# Patient Record
Sex: Female | Born: 1935 | Race: White | Hispanic: No | Smoking: Never smoker
Health system: Southern US, Community
[De-identification: ages and names within clinical notes are randomized; demographics above are authoritative.]

## PROBLEM LIST (undated history)

## (undated) DIAGNOSIS — D649 Anemia, unspecified: Secondary | ICD-10-CM

## (undated) DIAGNOSIS — E079 Disorder of thyroid, unspecified: Secondary | ICD-10-CM

## (undated) DIAGNOSIS — Z8719 Personal history of other diseases of the digestive system: Secondary | ICD-10-CM

## (undated) DIAGNOSIS — IMO0002 Reserved for concepts with insufficient information to code with codable children: Secondary | ICD-10-CM

## (undated) DIAGNOSIS — I4891 Unspecified atrial fibrillation: Secondary | ICD-10-CM

## (undated) DIAGNOSIS — K922 Gastrointestinal hemorrhage, unspecified: Secondary | ICD-10-CM

## (undated) DIAGNOSIS — M25559 Pain in unspecified hip: Secondary | ICD-10-CM

## (undated) DIAGNOSIS — I10 Essential (primary) hypertension: Secondary | ICD-10-CM

## (undated) HISTORY — DX: Reserved for concepts with insufficient information to code with codable children: IMO0002

## (undated) HISTORY — DX: Unspecified atrial fibrillation: I48.91

## (undated) HISTORY — PX: THYROIDECTOMY: SHX17

## (undated) HISTORY — PX: FETAL BLOOD TRANSFUSION: SHX1602

## (undated) HISTORY — DX: Gastrointestinal hemorrhage, unspecified: K92.2

## (undated) HISTORY — DX: Pain in unspecified hip: M25.559

## (undated) HISTORY — DX: Anemia, unspecified: D64.9

## (undated) HISTORY — DX: Disorder of thyroid, unspecified: E07.9

## (undated) HISTORY — DX: Personal history of other diseases of the digestive system: Z87.19

## (undated) HISTORY — DX: Essential (primary) hypertension: I10

---
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
## 1997-03-22 HISTORY — PX: CHOLECYSTECTOMY: SHX55

## 2003-10-01 ENCOUNTER — Other Ambulatory Visit: Payer: Self-pay

## 2004-07-27 ENCOUNTER — Ambulatory Visit: Payer: Self-pay | Admitting: Internal Medicine

## 2004-08-19 ENCOUNTER — Ambulatory Visit: Payer: Self-pay | Admitting: Oncology

## 2004-09-16 ENCOUNTER — Ambulatory Visit: Payer: Self-pay | Admitting: Oncology

## 2005-04-29 ENCOUNTER — Ambulatory Visit: Payer: Self-pay | Admitting: Internal Medicine

## 2005-06-14 ENCOUNTER — Ambulatory Visit: Payer: Self-pay | Admitting: Internal Medicine

## 2005-08-18 ENCOUNTER — Ambulatory Visit: Payer: Self-pay | Admitting: Oncology

## 2005-10-01 ENCOUNTER — Ambulatory Visit: Payer: Self-pay | Admitting: Ophthalmology

## 2005-11-09 ENCOUNTER — Ambulatory Visit: Payer: Self-pay | Admitting: Internal Medicine

## 2006-04-01 ENCOUNTER — Ambulatory Visit: Payer: Self-pay | Admitting: Ophthalmology

## 2006-09-01 ENCOUNTER — Ambulatory Visit: Payer: Self-pay | Admitting: Internal Medicine

## 2006-09-05 ENCOUNTER — Ambulatory Visit: Payer: Self-pay | Admitting: Internal Medicine

## 2006-09-07 ENCOUNTER — Ambulatory Visit: Payer: Self-pay | Admitting: Internal Medicine

## 2006-10-06 ENCOUNTER — Ambulatory Visit: Payer: Self-pay | Admitting: Surgery

## 2006-10-26 ENCOUNTER — Ambulatory Visit: Payer: Self-pay | Admitting: Surgery

## 2006-11-02 ENCOUNTER — Ambulatory Visit: Payer: Self-pay | Admitting: Surgery

## 2007-07-08 ENCOUNTER — Other Ambulatory Visit: Payer: Self-pay

## 2007-07-08 ENCOUNTER — Inpatient Hospital Stay: Payer: Self-pay | Admitting: Internal Medicine

## 2007-07-17 ENCOUNTER — Ambulatory Visit: Payer: Self-pay | Admitting: Internal Medicine

## 2007-07-19 ENCOUNTER — Other Ambulatory Visit: Payer: Self-pay

## 2007-07-19 ENCOUNTER — Inpatient Hospital Stay: Payer: Self-pay | Admitting: Internal Medicine

## 2007-08-04 ENCOUNTER — Ambulatory Visit: Payer: Self-pay | Admitting: Otolaryngology

## 2007-08-10 ENCOUNTER — Ambulatory Visit: Payer: Self-pay | Admitting: Otolaryngology

## 2007-08-16 ENCOUNTER — Inpatient Hospital Stay: Payer: Self-pay | Admitting: Otolaryngology

## 2007-08-21 ENCOUNTER — Ambulatory Visit: Payer: Self-pay | Admitting: Oncology

## 2007-08-26 IMAGING — CR DG THORACIC SPINE 2-3V
1 series · 3 of 3 positions shown · non-contrast
Comparison: none

[Series 1: view not recorded · 0.17mm/px · 3 of 3 slices shown]
[im 1/3]
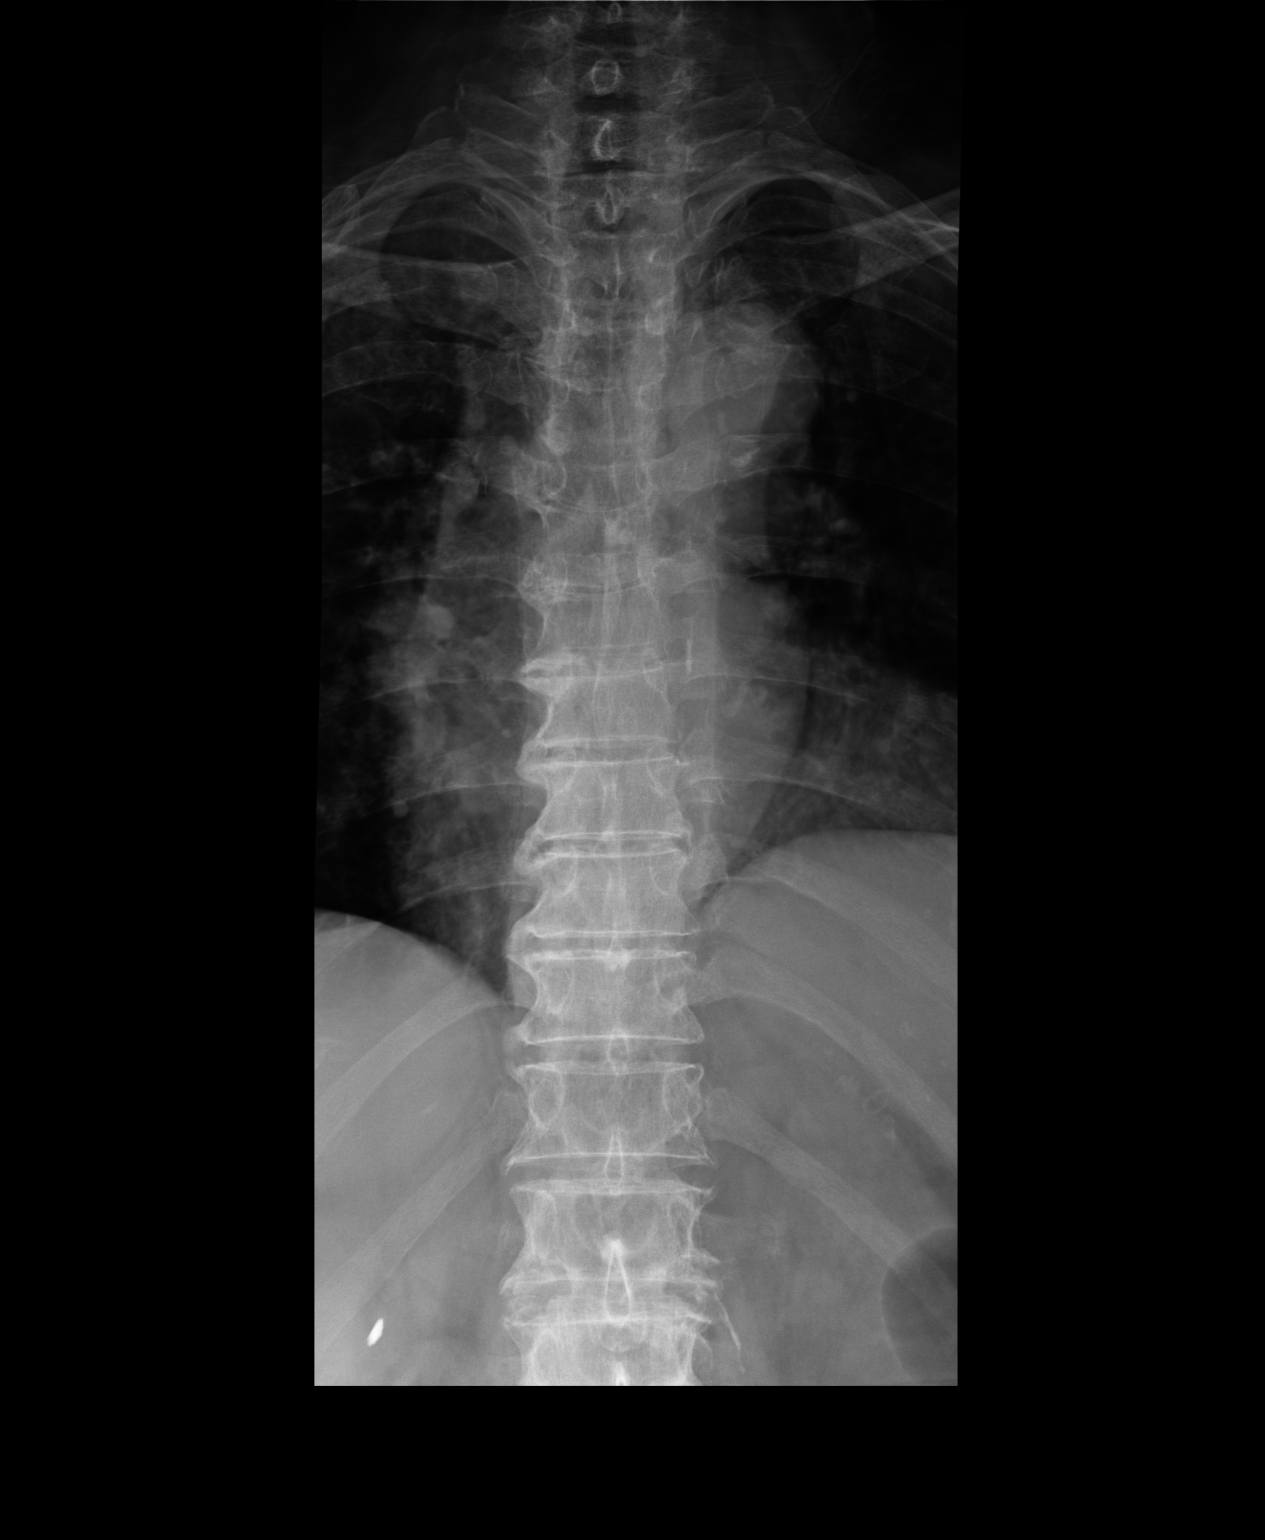
[im 2/3]
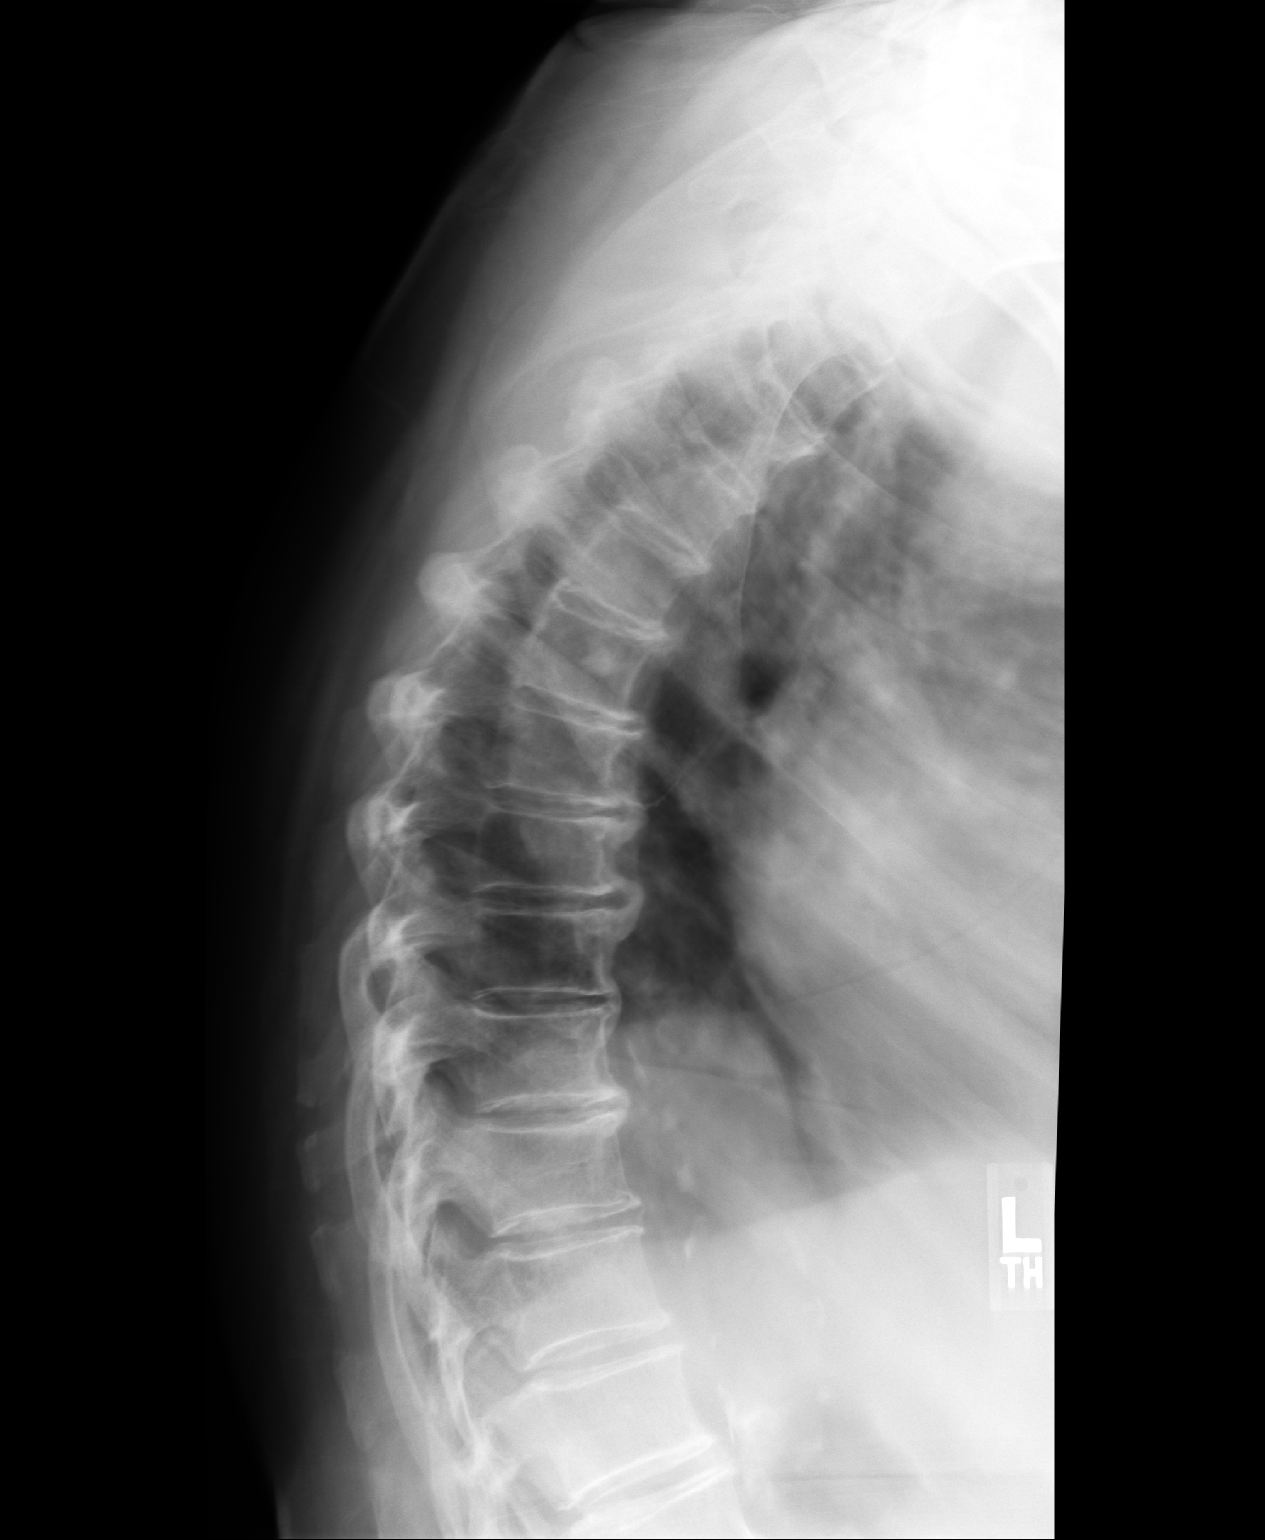
[im 3/3]
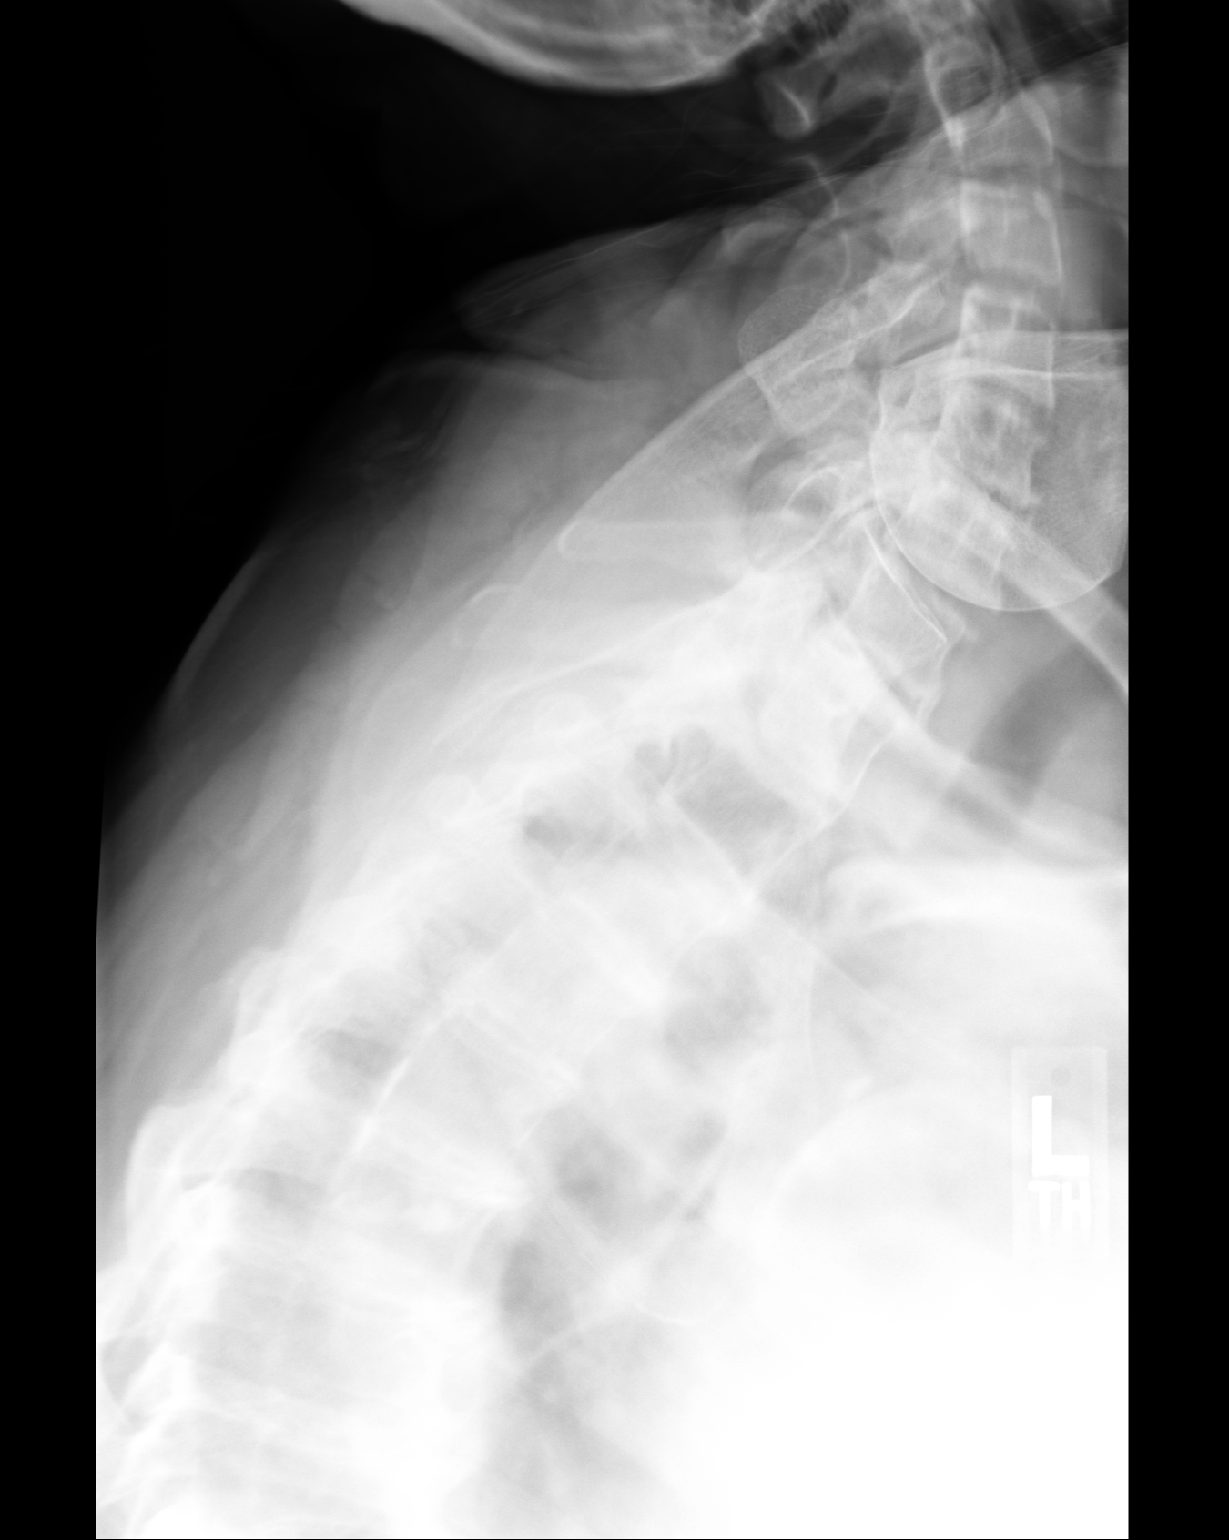

[3 of 3 positions shown; findings below may reference images not displayed]

Canned report from images found in remote index.

Refer to host system for actual result text.

## 2007-09-07 ENCOUNTER — Ambulatory Visit: Payer: Self-pay | Admitting: Oncology

## 2007-09-20 ENCOUNTER — Ambulatory Visit: Payer: Self-pay | Admitting: Oncology

## 2007-10-21 ENCOUNTER — Ambulatory Visit: Payer: Self-pay | Admitting: Oncology

## 2007-10-30 ENCOUNTER — Ambulatory Visit: Payer: Self-pay | Admitting: Internal Medicine

## 2007-11-21 ENCOUNTER — Ambulatory Visit: Payer: Self-pay | Admitting: Oncology

## 2007-12-04 ENCOUNTER — Ambulatory Visit: Payer: Self-pay | Admitting: Internal Medicine

## 2008-01-21 ENCOUNTER — Ambulatory Visit: Payer: Self-pay | Admitting: Oncology

## 2008-02-08 ENCOUNTER — Ambulatory Visit: Payer: Self-pay | Admitting: Oncology

## 2008-02-20 ENCOUNTER — Ambulatory Visit: Payer: Self-pay | Admitting: Oncology

## 2008-03-13 ENCOUNTER — Ambulatory Visit: Payer: Self-pay | Admitting: Internal Medicine

## 2008-06-20 ENCOUNTER — Ambulatory Visit: Payer: Self-pay | Admitting: Oncology

## 2008-06-27 ENCOUNTER — Ambulatory Visit: Payer: Self-pay | Admitting: Oncology

## 2008-07-20 ENCOUNTER — Ambulatory Visit: Payer: Self-pay | Admitting: Oncology

## 2008-12-20 ENCOUNTER — Ambulatory Visit: Payer: Self-pay | Admitting: Oncology

## 2008-12-25 ENCOUNTER — Ambulatory Visit: Payer: Self-pay | Admitting: Oncology

## 2009-01-03 IMAGING — US ULTRASOUND LEFT BREAST
1 series · 17 of 25 positions shown · non-contrast
Comparison: none

REASON FOR EXAM: Left breast nodule measuring 1.5 cm
COMMENTS:

[Series 1: ultrasound left breast · 17 of 33 slices shown]
[im 1/33]
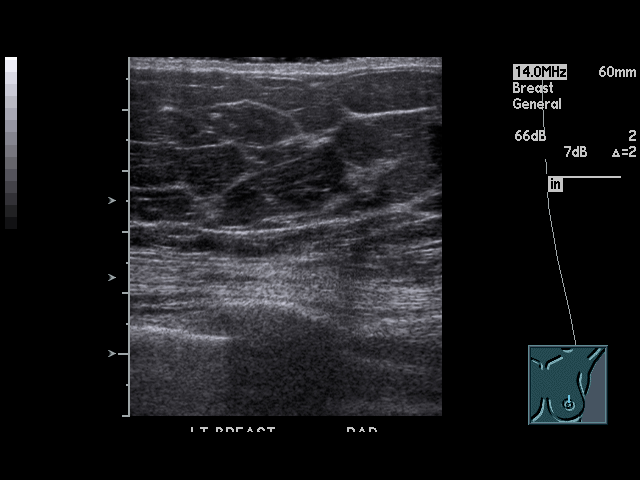
[im 3/33]
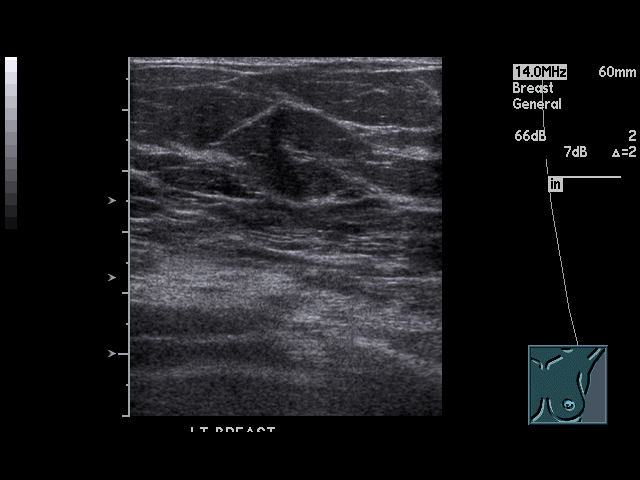
[im 5/33]
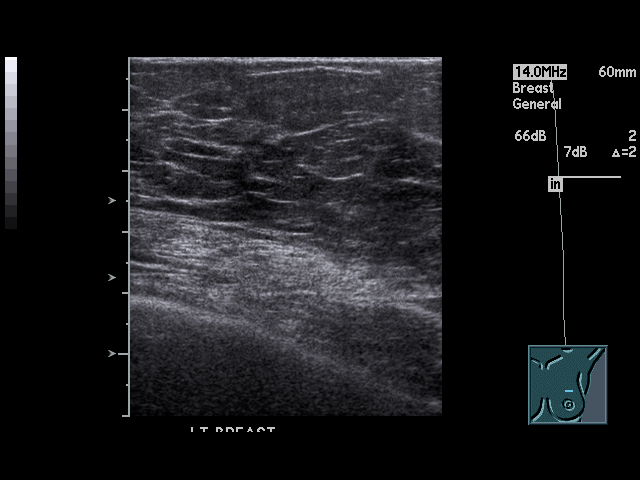
[im 7/33]
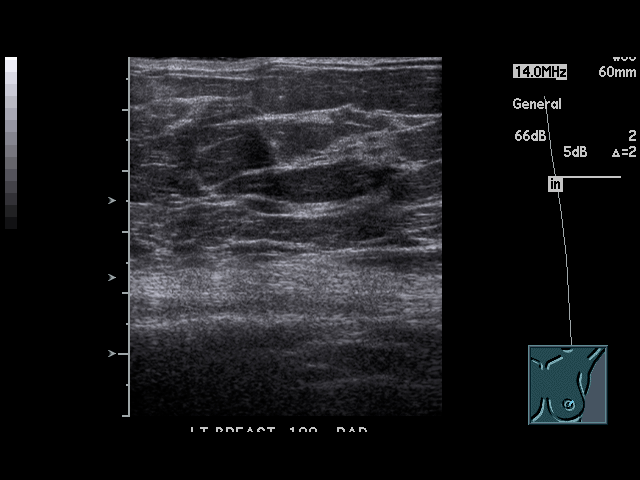
[im 9/33]
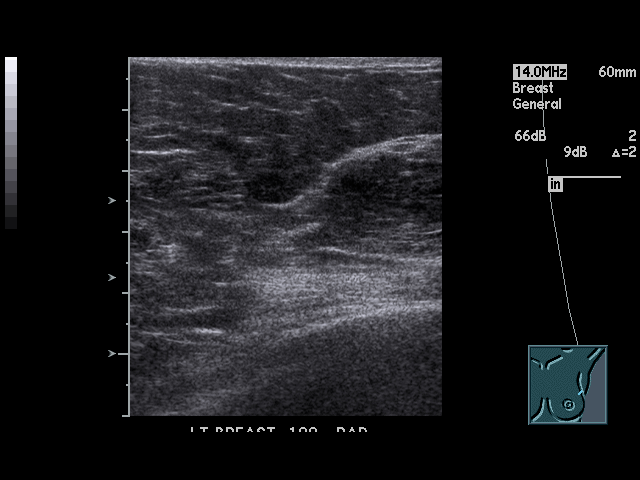
[im 11/33]
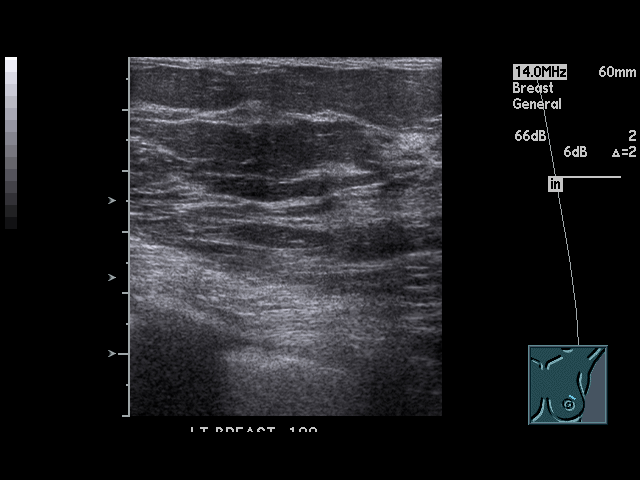
[im 13/33]
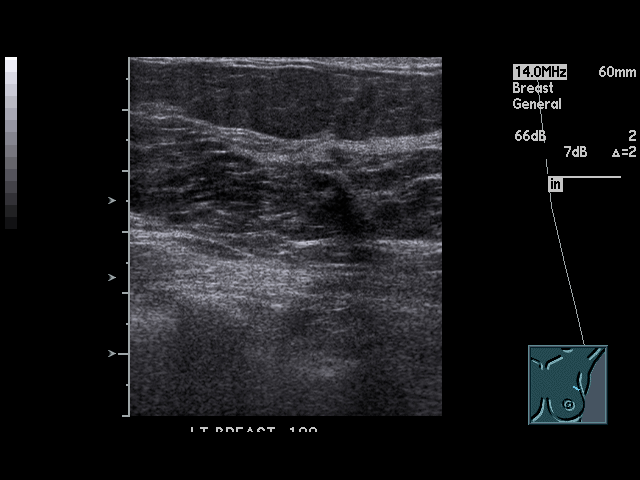
[im 15/33]
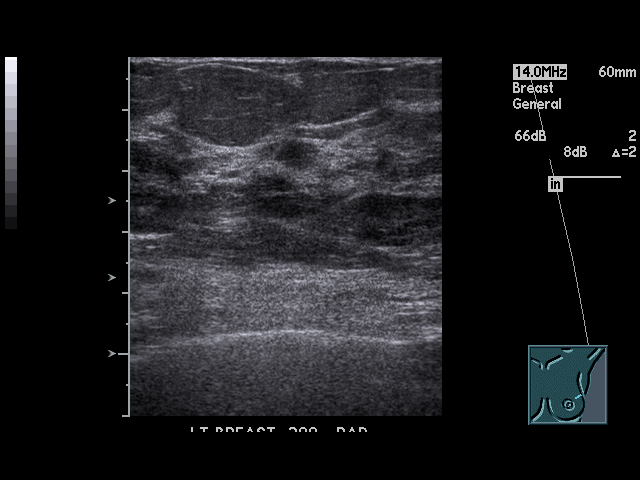
[im 17/33]
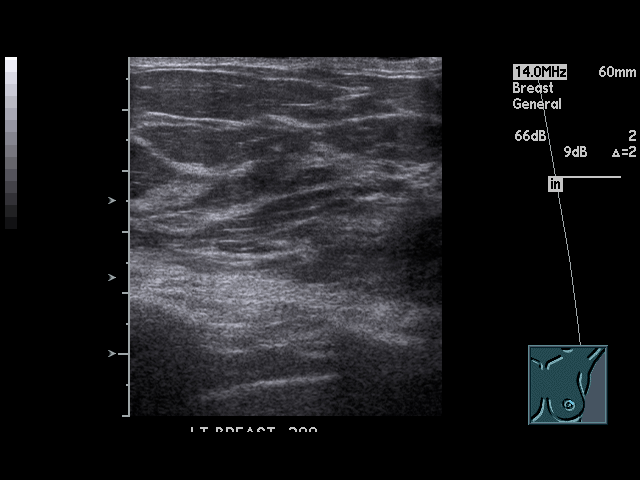
[im 18/33]
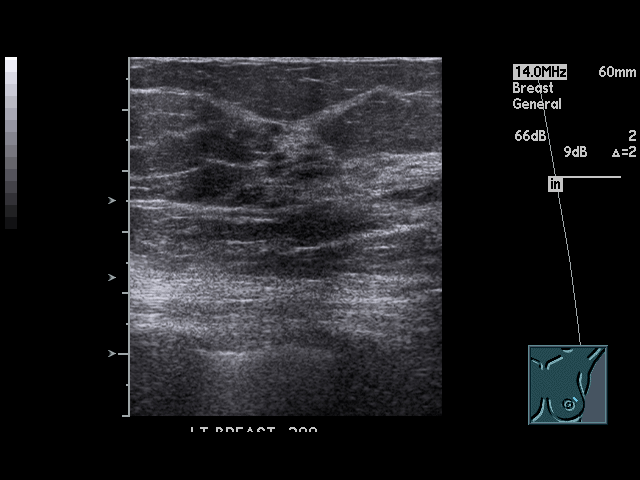
[im 21/33]
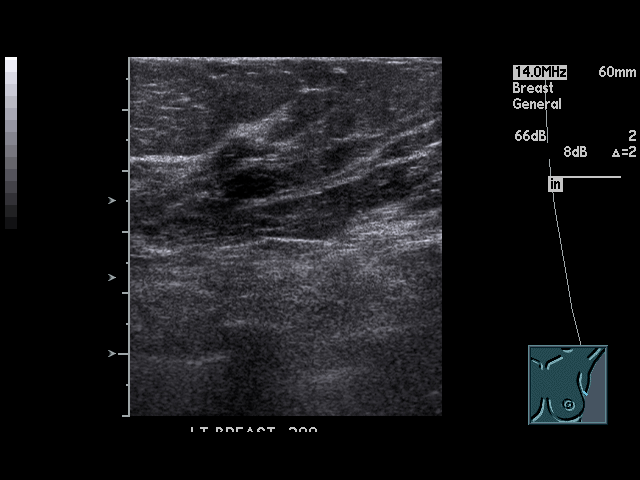
[im 22/33]
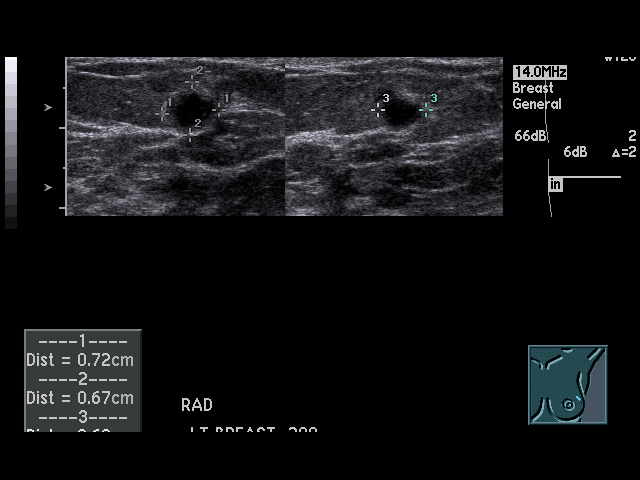
[im 25/33]
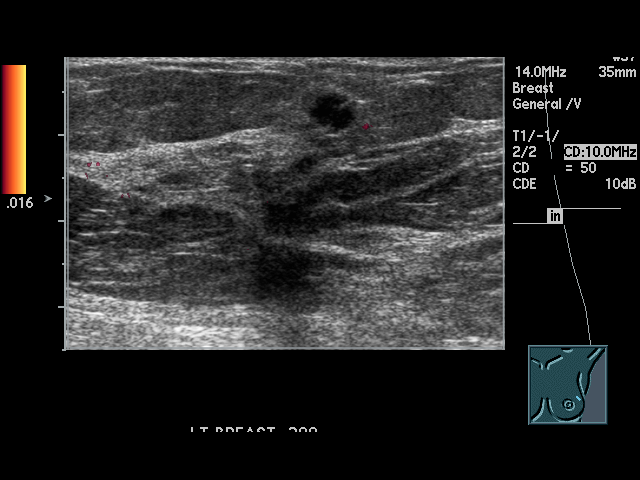
[im 26/33]
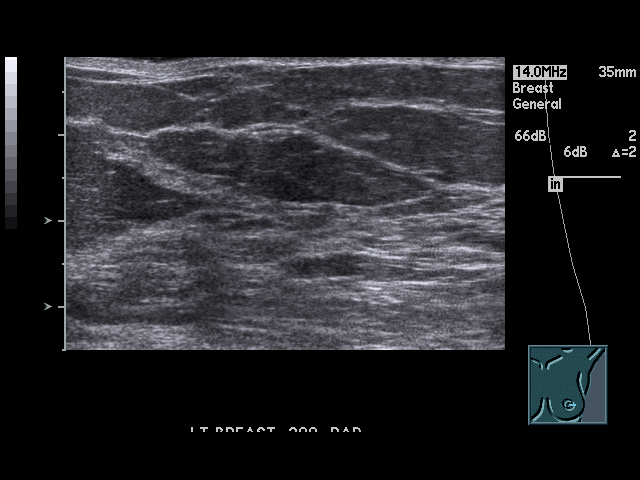
[im 29/33]
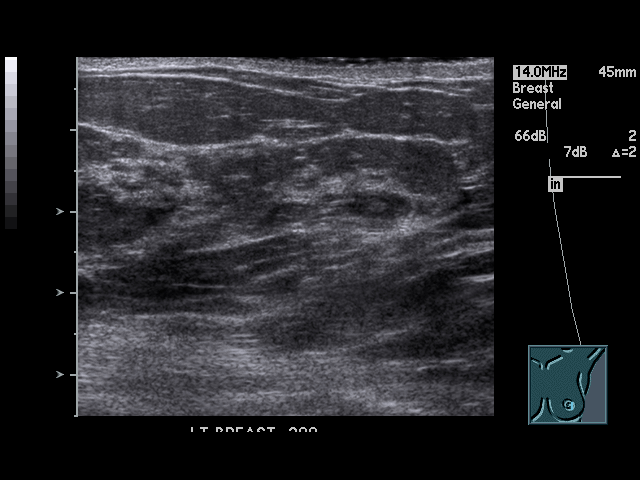
[im 30/33]
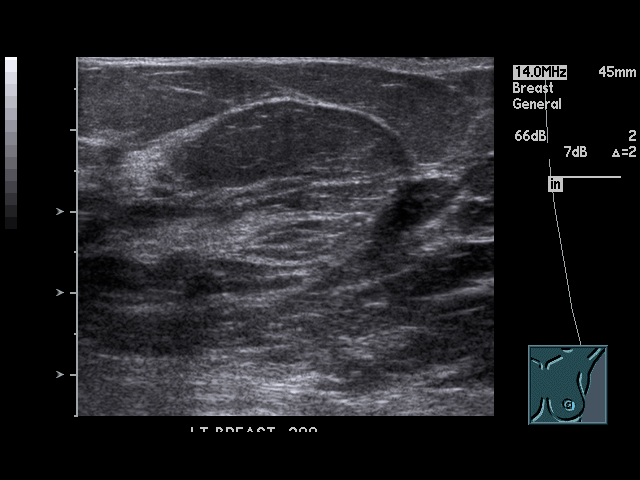
[im 33/33]
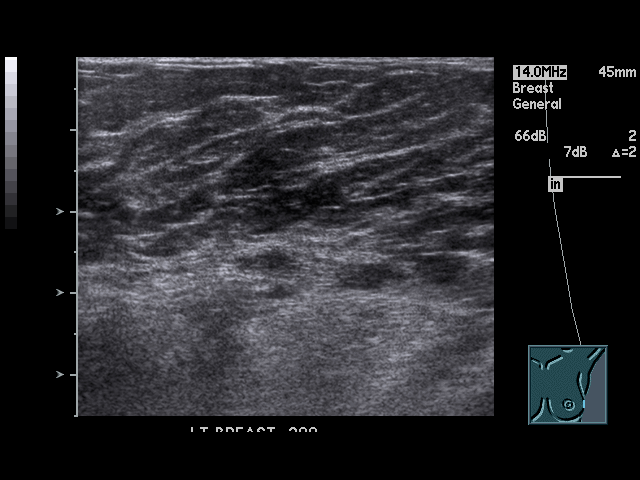

[17 of 25 positions shown; findings below may reference images not displayed]

PROCEDURE:     US  - US BREAST LEFT  - September 07, 2006 [DATE]

RESULT:     A hypoechoic nodule measuring approximately 7.0 mm with a thick
wall is noted at approximately 2 o'clock in the LEFT breast. This
corresponds to the nodular density noted in the LEFT breast. Needle
localization and biopsy is suggested for further evaluation as this could
represent a tumor.
IMPRESSION: Nodular density in the upper outer aspect of the LEFT breast
is hypoechoic with thick walls by ultrasound. Needle localization and
surgical removal of this nodule is suggested. This could represent a small
malignancy.

BI-RADS: Category 4 - Suspicious Abnormality

Thank you for this opportunity to contribute to the care of your patient.

A NEGATIVE MAMMOGRAM REPORT DOES NOT PRECLUDE BIOPSY OR OTHER EVALUATION OF
A CLINICALLY PALPABLE OR OTHERWISE SUSPICIOUS MASS OR LESION. BREAST CANCER
MAY NOT BE DETECTED BY MAMMOGRAPHY IN UP TO 10% OF CASES.

## 2009-01-20 ENCOUNTER — Ambulatory Visit: Payer: Self-pay | Admitting: Oncology

## 2009-11-03 IMAGING — CR DG CHEST 2V
1 series · 2 of 2 positions shown · non-contrast
Comparison: none

REASON FOR EXAM: sob
COMMENTS:

PROCEDURE:     DXR - DXR CHEST PA (OR AP) AND LATERAL  - July 08, 2007 [DATE]
RESULT:     Comparison: 09/01/2006.

[Series 1: view not recorded · 0.17mm/px · 2 of 2 slices shown]
[im 1/2]
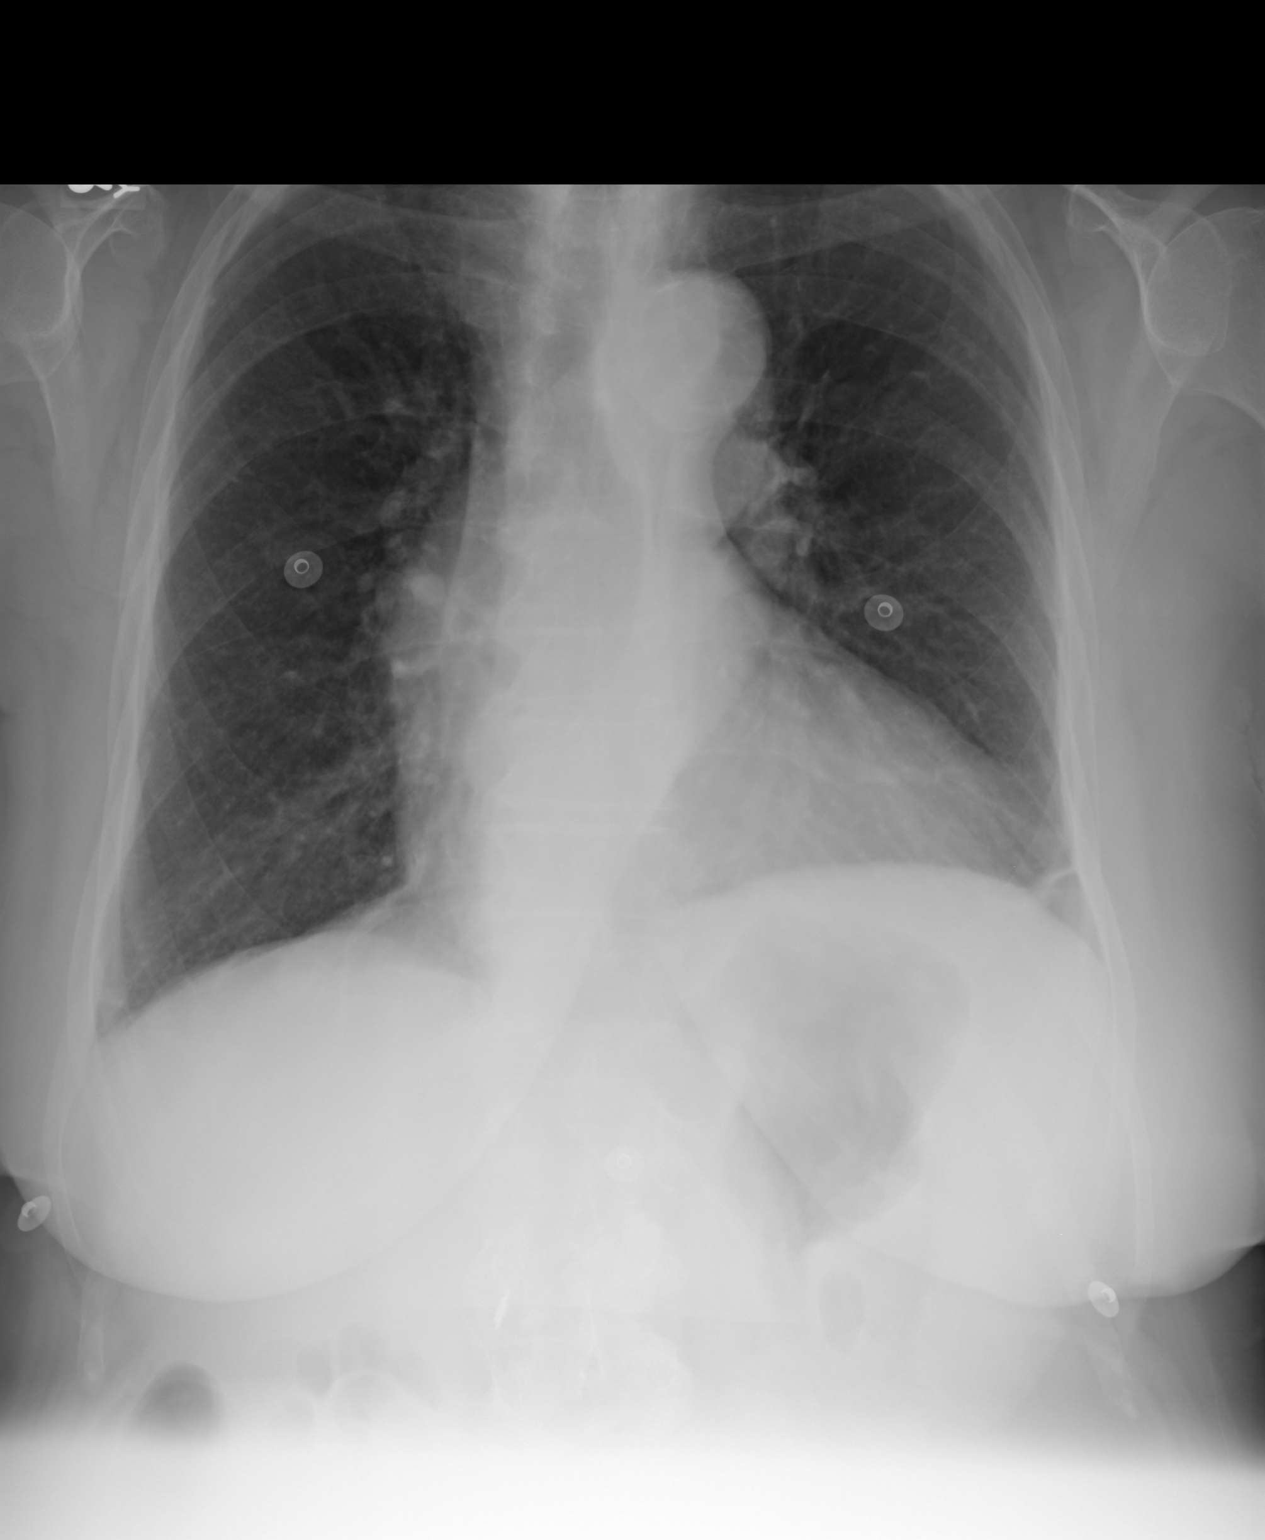
[im 2/2]
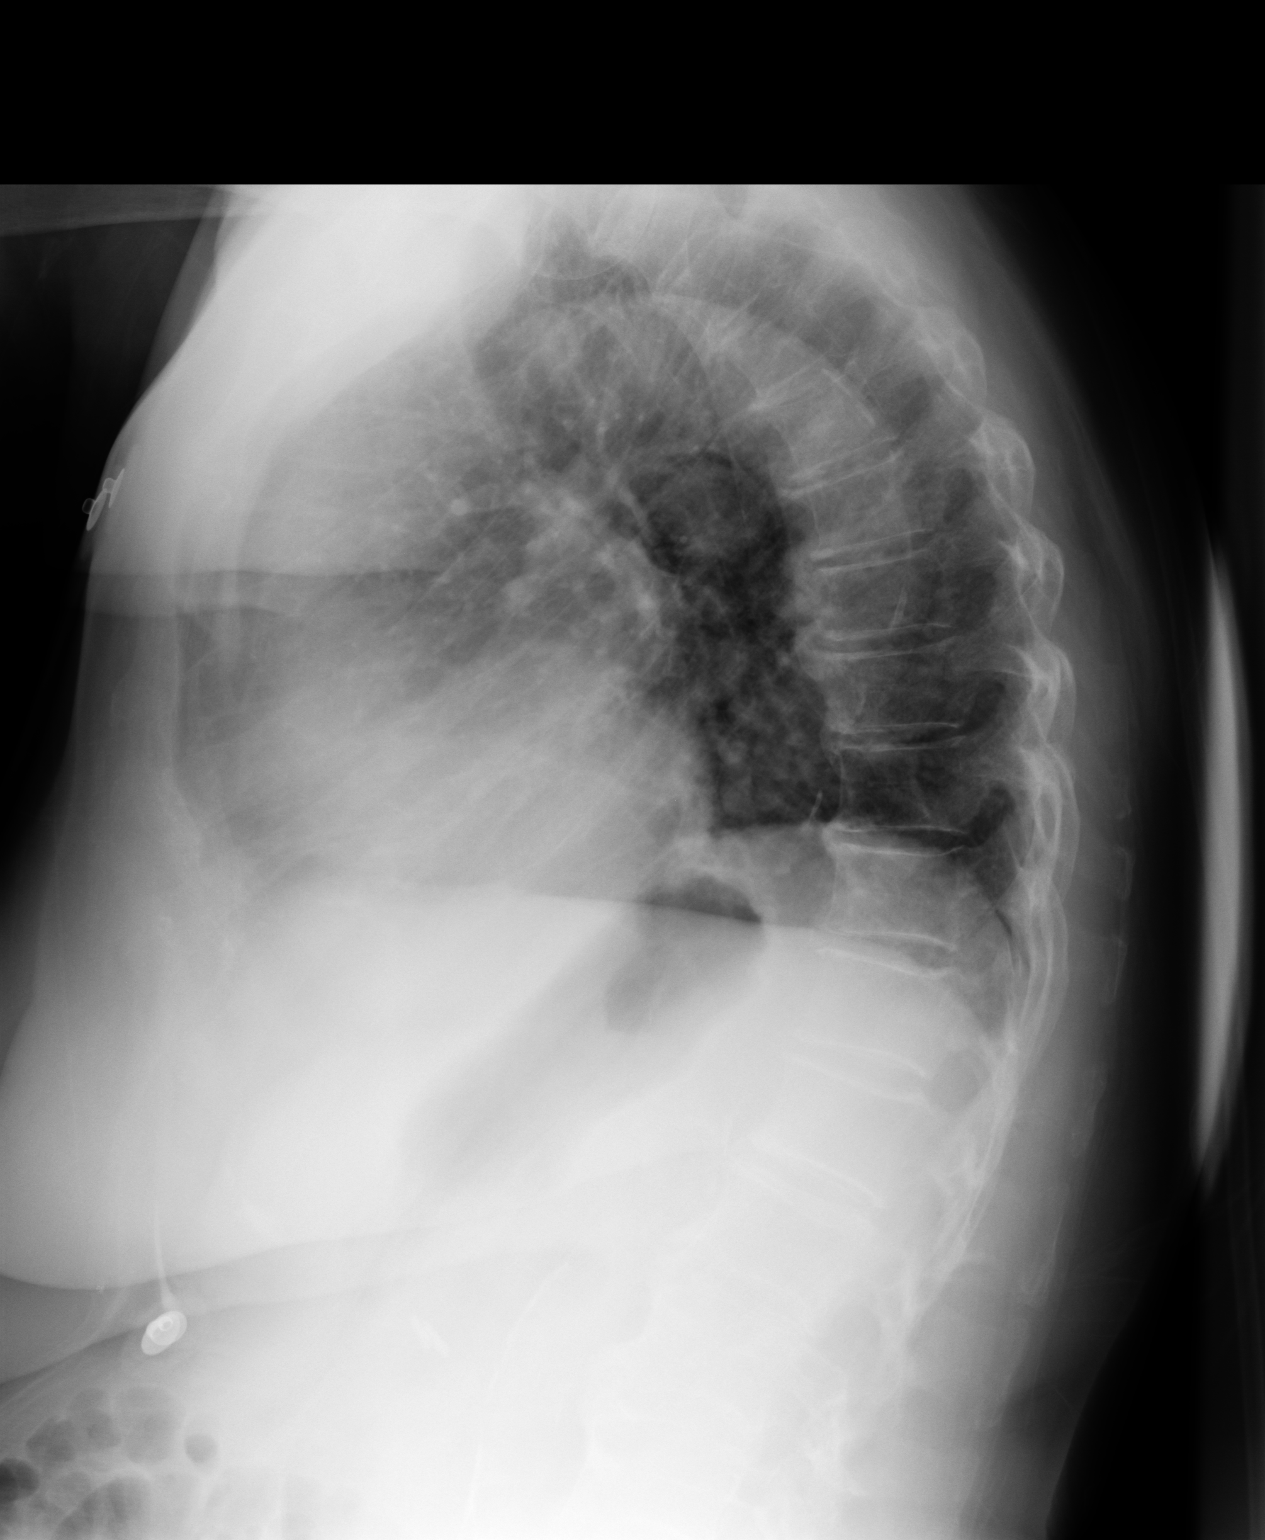

[2 of 2 positions shown; findings below may reference images not displayed]

FINDINGS: Mild bibasilar atelectasis or scarring. There is otherwise no significant
pulmonary consolidation, pulmonary edema, pleural effusion, nor
pneumothorax. Stable mild cardiomegaly.

No grossly displaced rib fracture is noted.
IMPRESSION: 1. No acute cardiopulmonary abnormality is noted.

## 2009-11-30 ENCOUNTER — Inpatient Hospital Stay: Payer: Self-pay | Admitting: Internal Medicine

## 2009-12-10 ENCOUNTER — Encounter: Payer: Self-pay | Admitting: Internal Medicine

## 2009-12-20 ENCOUNTER — Encounter: Payer: Self-pay | Admitting: Internal Medicine

## 2010-03-28 ENCOUNTER — Inpatient Hospital Stay: Payer: Medicare Other | Admitting: Internal Medicine

## 2010-07-28 ENCOUNTER — Ambulatory Visit: Payer: Medicare Other | Admitting: Internal Medicine

## 2010-08-31 LAB — BASIC METABOLIC PANEL
BUN: 18 mg/dL (ref 4–21)
Creatinine: 1 mg/dL (ref 0.5–1.1)
Glucose: 98 mg/dL
Potassium: 4 mmol/L (ref 3.4–5.3)
Sodium: 141 mmol/L (ref 137–147)

## 2010-08-31 LAB — HEPATIC FUNCTION PANEL: Alkaline Phosphatase: 86 U/L (ref 25–125)

## 2010-09-01 ENCOUNTER — Ambulatory Visit: Payer: Medicare Other | Admitting: Internal Medicine

## 2010-09-16 ENCOUNTER — Ambulatory Visit: Payer: Self-pay | Admitting: Cardiovascular Disease

## 2010-09-17 ENCOUNTER — Encounter: Payer: Self-pay | Admitting: Cardiovascular Disease

## 2010-09-17 ENCOUNTER — Ambulatory Visit (INDEPENDENT_AMBULATORY_CARE_PROVIDER_SITE_OTHER): Payer: Medicare Other | Admitting: Cardiovascular Disease

## 2010-09-17 DIAGNOSIS — I2510 Atherosclerotic heart disease of native coronary artery without angina pectoris: Secondary | ICD-10-CM | POA: Insufficient documentation

## 2010-09-17 DIAGNOSIS — R079 Chest pain, unspecified: Secondary | ICD-10-CM

## 2010-09-17 DIAGNOSIS — I10 Essential (primary) hypertension: Secondary | ICD-10-CM

## 2010-09-17 DIAGNOSIS — R5381 Other malaise: Secondary | ICD-10-CM

## 2010-09-17 MED ORDER — NITROGLYCERIN 0.4 MG SL SUBL: 0.4000 mg | SUBLINGUAL_TABLET | SUBLINGUAL | Status: DC | PRN

## 2010-09-17 NOTE — Assessment & Plan Note (Signed)
She reports a history of coronary artery disease, angioplasty 1995. She's not currently on any cholesterol medication. She is not particularly interested. She does have underlying left bundle branch block. She is relatively sedentary and is not particularly symptomatic. We will continue to monitor her closely. Nitroglycerin for chest pain.

## 2010-09-17 NOTE — Assessment & Plan Note (Signed)
We discussed the various treatment options for her chest pain. These included stress testing, cardiac catheterization. She did have significant chest pain 2 weeks ago. She would like to manage it medically at this time. We will renew her nitroglycerin sublingual. I suggested to her that if she has worsening and more frequent episodes of chest pain, that she call us for further workup. She agrees that she will call us.

## 2010-09-17 NOTE — Progress Notes (Signed)
   Patient ID: Grace Bennett, female    DOB: 1918-08-19, 77 y.o.   MRN: [id number]  HPI Comments: Grace Bennett is a very pleasant 77 year old woman, patient of Dr. Darrick Huntsman, with a past medical history of atrial fibrillation, coronary artery disease with angioplasty in 1995, mild carotid arterial disease, thyroid cancer with resection currently on thyroid supplementation, insomnia who presents by referral for episodes of chest pain   And general malaise.  She reports that she has had weakness and malaise for some time. She does not sleep well. She has tried a sleeping pill and even doubled the dose though still does not sleep well. She wakes feeling fatigued. She does not have any energy during the daytime.  She had an episode of severe chest pain 2 weeks ago. She took nitroglycerin and the pain seemed to ease over the next 10 minutes. She has not had further episodes prior to that or since then. She tries to stay active but does not walk very far. She walks with a cane or a walker.  She is not interested in any cardiac catheterization or stress testing at this time.  On her last visit to [facility], her ACE inhibitor and metoprolol was held. She does not think there has been a significant change in the way she feels. She does not feel worse and does not feel significantly better.  EKG shows left bundle branch block with normal sinus rhythm, rate 67 beats per minute     Review of Systems  Constitutional: Positive for fatigue.  HENT: Negative.   Eyes: Negative.   Respiratory: Positive for shortness of breath.   Cardiovascular: Positive for chest pain.  Gastrointestinal: Negative.   Musculoskeletal: Positive for gait problem.  Skin: Negative.   Neurological: Positive for weakness.  Hematological: Negative.   Psychiatric/Behavioral: Negative.     BP 112/73  Pulse 67  Wt 200 lb (90.719 kg)   Physical Exam  Nursing note and vitals reviewed. Constitutional: She is oriented to person, place, and  time. She appears well-developed and well-nourished.       Elderly woman in no apparent distress, difficulty climbing from the chair to the table  HENT:  Head: Normocephalic.  Nose: Nose normal.  Mouth/Throat: Oropharynx is clear and moist.  Eyes: Conjunctivae are normal. Pupils are equal, round, and reactive to light.  Neck: Normal range of motion. Neck supple. No JVD present.  Cardiovascular: Normal rate, regular rhythm, S1 normal, S2 normal, normal heart sounds and intact distal pulses.  Exam reveals no gallop and no friction rub.   No murmur heard. Pulmonary/Chest: Effort normal and breath sounds normal. No respiratory distress. She has no wheezes. She has no rales. She exhibits no tenderness.  Abdominal: Soft. Bowel sounds are normal. She exhibits no distension. There is no tenderness.  Musculoskeletal: Normal range of motion. She exhibits no edema and no tenderness.  Lymphadenopathy:    She has no cervical adenopathy.  Neurological: She is alert and oriented to person, place, and time. Coordination normal.  Skin: Skin is warm and dry. No rash noted. No erythema.  Psychiatric: She has a normal mood and affect. Her behavior is normal. Judgment and thought content normal.         Assessment and Plan

## 2010-09-17 NOTE — Assessment & Plan Note (Signed)
Her general malaise and weakness has been ongoing for some time. Etiology is uncertain. She does not feel better with a improved blood pressure and heart rate. Her metoprolol and ACE inhibitor were held and this did not seem to make any significant improvement.   I am concerned about her insomnia and poor sleep hygiene. She wakes feeling tired, only sleeps a maximum of 5 hours a night. I've asked her to talk with Dr. Darrick Huntsman concerning other sleep medications. Last her to stay active as much as possible as this will also help her sleep. I did offer home physical therapy, physical therapy at [facility] and she was not particularly interested.

## 2010-09-17 NOTE — Assessment & Plan Note (Signed)
Blood pressure is well controlled on today's visit. No changes made to the medications. 

## 2010-09-17 NOTE — Patient Instructions (Addendum)
You are doing well. No medication changes were made. Please call us if you have new issues that need to be addressed before your next appt.  We will call you for a follow up Appt. In 6 months  

## 2010-09-25 ENCOUNTER — Encounter: Payer: Self-pay | Admitting: Cardiovascular Disease

## 2010-10-30 ENCOUNTER — Encounter: Payer: Self-pay | Admitting: Internal Medicine

## 2010-11-13 ENCOUNTER — Emergency Department: Payer: Medicare Other | Admitting: Emergency Medicine

## 2010-11-18 ENCOUNTER — Ambulatory Visit (INDEPENDENT_AMBULATORY_CARE_PROVIDER_SITE_OTHER): Payer: Medicare Other | Admitting: Internal Medicine

## 2010-11-18 ENCOUNTER — Encounter: Payer: Self-pay | Admitting: Internal Medicine

## 2010-11-18 VITALS — BP 125/74 | HR 111 | Temp 97.9°F | Resp 16 | Ht 64.0 in | Wt 194.0 lb

## 2010-11-18 DIAGNOSIS — E538 Deficiency of other specified B group vitamins: Secondary | ICD-10-CM

## 2010-11-18 DIAGNOSIS — R42 Dizziness and giddiness: Secondary | ICD-10-CM

## 2010-11-18 DIAGNOSIS — R5383 Other fatigue: Secondary | ICD-10-CM

## 2010-11-18 MED ORDER — TEMAZEPAM 15 MG PO CAPS: 15.0000 mg | ORAL_CAPSULE | Freq: Every evening | ORAL | Status: DC | PRN

## 2010-11-18 MED ORDER — ALPRAZOLAM 0.25 MG PO TABS: 0.2500 mg | ORAL_TABLET | Freq: Every evening | ORAL | Status: DC | PRN

## 2010-11-18 NOTE — Progress Notes (Signed)
Subjective:    Patient ID: Grace Bennett, female    DOB: 1919/01/03, 77 y.o.   MRN: [id number]  HPI 77 yo white female her for ER followup following a fall which occurred 5 days ago while descending a small ramp without her walker. She fell forward onto her face and right wrist, smashing her glasses.  She bled profusely from her nose and a forehead laceration.  Was taken to ER for evaluation  With head CT and wrist films.  She was seen in followup by [facility] for persistent wrist pain and question of hairline fracture, but repeat films were normal. Has a large resolving eccymosis covering left upper quarter of face .  Wearing right arm brace over wrist.  Then 2 days later was unable to get up from her chair at home for 4 hours and finally called EMS.  Leg muscles are still painful and weak since then bc of 4 hours of straining.   Current Outpatient Prescriptions on File Prior to Visit  Medication Sig Dispense Refill  . amiodarone (PACERONE) 200 MG tablet Take 200 mg by mouth daily.        . furosemide (LASIX) 20 MG tablet Take 20 mg by mouth daily.        . hydrochlorothiazide 25 MG tablet Take 25 mg by mouth daily.        Marland Kitchen levothyroxine (SYNTHROID, LEVOTHROID) 137 MCG tablet Take 137 mcg by mouth daily.        . nitroGLYCERIN (NITROSTAT) 0.4 MG SL tablet Place 1 tablet (0.4 mg total) under the tongue every 5 (five) minutes as needed.  25 tablet  3  . pantoprazole (PROTONIX) 40 MG tablet Take 40 mg by mouth daily.        . potassium chloride (MICRO-K) 10 MEQ CR capsule Take 10 mEq by mouth daily.          Review of Systems  Constitutional: Negative for fever, chills and unexpected weight change.  HENT: Negative for hearing loss, ear pain, nosebleeds, congestion, sore throat, facial swelling, rhinorrhea, sneezing, mouth sores, trouble swallowing, neck pain, neck stiffness, voice change, postnasal drip, sinus pressure, tinnitus and ear discharge.   Eyes: Negative for pain, discharge,  redness and visual disturbance.  Respiratory: Negative for cough, chest tightness, shortness of breath, wheezing and stridor.   Cardiovascular: Negative for chest pain, palpitations and leg swelling.  Musculoskeletal: Negative for myalgias and arthralgias.  Skin: Negative for color change and rash.  Neurological: Negative for dizziness, weakness, light-headedness and headaches.  Hematological: Negative for adenopathy.   BP 125/74  Pulse 111  Temp(Src) 97.9 F (36.6 C) (Oral)  Resp 16  Ht 5\' 4"  (1.626 m)  Wt 194 lb (87.998 kg)  BMI 33.30 kg/m2  SpO2 93%    Objective:   Physical Exam  Constitutional: She is oriented to person, place, and time. She appears well-developed and well-nourished.  HENT:  Head: Normocephalic.    Mouth/Throat: Oropharynx is clear and moist.  Eyes: EOM are normal. Pupils are equal, round, and reactive to light. Left conjunctiva is not injected.  Neck: Trachea normal and normal range of motion. Neck supple. No JVD present. No mass and no thyromegaly present.  Cardiovascular: Normal rate, regular rhythm, S1 normal, S2 normal, normal heart sounds and intact distal pulses.   No murmur heard. Pulmonary/Chest: Effort normal and breath sounds normal. No respiratory distress.  Abdominal: Soft. Normal appearance and bowel sounds are normal. She exhibits no mass. There is no hepatosplenomegaly. There  is no rebound.  Musculoskeletal: Normal range of motion. She exhibits no edema.  Lymphadenopathy:    She has no cervical adenopathy.  Neurological: She is alert and oriented to person, place, and time.  Skin: Skin is warm and dry.  Psychiatric: She has a normal mood and affect.          Assessment & Plan:  1) Geriatric fall:secodnary to balance disorder and failure to use walker.  She is now having trouble risng from a seated chair and does not drive .  Home PT ordered for vestibular training.  She s wearing her Lifeline and has used it twice in the last two  weeks.

## 2010-11-18 NOTE — Patient Instructions (Signed)
Please use your walker EVERYWHERE!!! We will arrange home physical therapy to help you regain your strength in your legs and to see if we can get you a lift chair.  You can return for monthly b12 injections if you prefer.  Return in 3 months.Marland Kitchen

## 2010-11-19 LAB — CBC WITH DIFFERENTIAL/PLATELET
Basophils Absolute: 0 10*3/uL (ref 0.0–0.1)
Eosinophils Relative: 2 % (ref 0.0–5.0)
Monocytes Relative: 9.5 % (ref 3.0–12.0)
Neutrophils Relative %: 68.8 % (ref 43.0–77.0)
Platelets: 201 10*3/uL (ref 150.0–400.0)
RDW: 16.2 % — ABNORMAL HIGH (ref 11.5–14.6)
WBC: 10.1 10*3/uL (ref 4.5–10.5)

## 2010-11-19 LAB — COMPREHENSIVE METABOLIC PANEL
Albumin: 4.1 g/dL (ref 3.5–5.2)
BUN: 27 mg/dL — ABNORMAL HIGH (ref 6–23)
CO2: 29 mEq/L (ref 19–32)
Calcium: 8.8 mg/dL (ref 8.4–10.5)
Chloride: 105 mEq/L (ref 96–112)
GFR: 37.52 mL/min — ABNORMAL LOW (ref >60.00)
Glucose, Bld: 94 mg/dL (ref 70–99)
Potassium: 4.3 mEq/L (ref 3.5–5.1)

## 2010-11-19 LAB — VITAMIN B12: Vitamin B-12: 402 pg/mL (ref 211–911)

## 2010-11-20 ENCOUNTER — Ambulatory Visit: Payer: Medicare Other | Admitting: Internal Medicine

## 2010-11-27 ENCOUNTER — Other Ambulatory Visit (INDEPENDENT_AMBULATORY_CARE_PROVIDER_SITE_OTHER): Payer: Medicare Other | Admitting: *Deleted

## 2010-11-27 DIAGNOSIS — R5381 Other malaise: Secondary | ICD-10-CM

## 2010-11-27 DIAGNOSIS — R5383 Other fatigue: Secondary | ICD-10-CM

## 2010-11-27 DIAGNOSIS — R42 Dizziness and giddiness: Secondary | ICD-10-CM

## 2010-11-27 LAB — BASIC METABOLIC PANEL
CO2: 31 mEq/L (ref 19–32)
GFR: 43.16 mL/min — ABNORMAL LOW (ref >60.00)
Glucose, Bld: 107 mg/dL — ABNORMAL HIGH (ref 70–99)
Potassium: 3.3 mEq/L — ABNORMAL LOW (ref 3.5–5.1)
Sodium: 143 mEq/L (ref 135–145)

## 2010-12-17 ENCOUNTER — Other Ambulatory Visit: Payer: Self-pay | Admitting: Internal Medicine

## 2010-12-17 DIAGNOSIS — M169 Osteoarthritis of hip, unspecified: Secondary | ICD-10-CM

## 2010-12-21 DIAGNOSIS — R42 Dizziness and giddiness: Secondary | ICD-10-CM

## 2010-12-21 DIAGNOSIS — IMO0001 Reserved for inherently not codable concepts without codable children: Secondary | ICD-10-CM

## 2010-12-21 DIAGNOSIS — R262 Difficulty in walking, not elsewhere classified: Secondary | ICD-10-CM

## 2011-01-06 ENCOUNTER — Telehealth: Payer: Self-pay | Admitting: *Deleted

## 2011-01-06 MED ORDER — AMIODARONE HCL 200 MG PO TABS: 200.0000 mg | ORAL_TABLET | Freq: Every day | ORAL | Status: DC

## 2011-01-06 MED ORDER — POTASSIUM CHLORIDE CRYS ER 20 MEQ PO TBCR: 10.0000 meq | EXTENDED_RELEASE_TABLET | Freq: Every day | ORAL | Status: DC

## 2011-01-06 MED ORDER — PANTOPRAZOLE SODIUM 40 MG PO TBEC: 40.0000 mg | DELAYED_RELEASE_TABLET | Freq: Every day | ORAL | Status: DC

## 2011-01-06 MED ORDER — HYDROCHLOROTHIAZIDE 25 MG PO TABS: 25.0000 mg | ORAL_TABLET | Freq: Every day | ORAL | Status: DC

## 2011-01-06 MED ORDER — FUROSEMIDE 20 MG PO TABS: 20.0000 mg | ORAL_TABLET | Freq: Every day | ORAL | Status: DC

## 2011-01-06 MED ORDER — LEVOTHYROXINE SODIUM 137 MCG PO TABS: 137.0000 ug | ORAL_TABLET | Freq: Every day | ORAL | Status: DC

## 2011-01-06 NOTE — Telephone Encounter (Signed)
RFs needed, done, pt's daughter informed

## 2011-01-08 ENCOUNTER — Other Ambulatory Visit: Payer: Self-pay | Admitting: Internal Medicine

## 2011-02-19 ENCOUNTER — Ambulatory Visit (INDEPENDENT_AMBULATORY_CARE_PROVIDER_SITE_OTHER): Payer: Medicare Other | Admitting: Internal Medicine

## 2011-02-19 ENCOUNTER — Encounter: Payer: Self-pay | Admitting: Internal Medicine

## 2011-02-19 DIAGNOSIS — F329 Major depressive disorder, single episode, unspecified: Secondary | ICD-10-CM

## 2011-02-19 DIAGNOSIS — E876 Hypokalemia: Secondary | ICD-10-CM

## 2011-02-19 DIAGNOSIS — I10 Essential (primary) hypertension: Secondary | ICD-10-CM

## 2011-02-19 DIAGNOSIS — Z79899 Other long term (current) drug therapy: Secondary | ICD-10-CM

## 2011-02-19 DIAGNOSIS — Z23 Encounter for immunization: Secondary | ICD-10-CM

## 2011-02-19 DIAGNOSIS — E039 Hypothyroidism, unspecified: Secondary | ICD-10-CM

## 2011-02-19 MED ORDER — ESCITALOPRAM OXALATE 10 MG PO TABS: 10.0000 mg | ORAL_TABLET | Freq: Every day | ORAL | Status: DC

## 2011-02-19 NOTE — Patient Instructions (Signed)
I am prescribing lexapro for your depression.  Start with 1/2 tablet daily with lunch.  If it makes you sleepy ,  Change to evening dosing .Marland Kitchen  You may increase to 1 tablet after 2 weeks if you think you need a higher dose

## 2011-02-20 LAB — MAGNESIUM: Magnesium: 2 mg/dL (ref 1.5–2.5)

## 2011-02-20 LAB — COMPREHENSIVE METABOLIC PANEL
ALT: 10 U/L (ref 0–35)
AST: 22 U/L (ref 0–37)
Creat: 1.11 mg/dL — ABNORMAL HIGH (ref 0.50–1.10)
Total Bilirubin: 0.4 mg/dL (ref 0.3–1.2)

## 2011-02-21 ENCOUNTER — Encounter: Payer: Self-pay | Admitting: Internal Medicine

## 2011-02-21 DIAGNOSIS — I4891 Unspecified atrial fibrillation: Secondary | ICD-10-CM | POA: Insufficient documentation

## 2011-02-21 DIAGNOSIS — F329 Major depressive disorder, single episode, unspecified: Secondary | ICD-10-CM | POA: Insufficient documentation

## 2011-02-21 NOTE — Assessment & Plan Note (Addendum)
Mild, with anhedonia, some sleep disruption, and admitted loneliness.  After 15 minutes of discussion she is willing to try a low dose SSRI for amelioriation of sympomts.  Dicussed risk and benefits of Lexapro, and any anticipated side effects,  Return in one month.

## 2011-02-21 NOTE — Assessment & Plan Note (Signed)
Well-controlled on current regimen. No changes today. 

## 2011-02-21 NOTE — Progress Notes (Signed)
Subjective:    Patient ID: Grace Bennett, female    DOB: [birth year], 77 y.o.   MRN: [id number]  HPI  Grace Bennett is a 77 yr old white female with a history of hypertension, atrial fibrillation , hypothyroidism, and gait disturbance with history of fall in August causing facial lacerations who presents for 4 month follow up.  Has been using her walker for ambulation with no recent falls.  NO pain complaints today,  Weakness has improved with home PT. Has been a little depressed lately due to loss of a neighbor and lack of social outlet. Admits that she is lonely.   Earl Lagos  Current Outpatient Prescriptions on File Prior to Visit  Medication Sig Dispense Refill  . ALPRAZolam (XANAX) 0.25 MG tablet Take 1 tablet (0.25 mg total) by mouth at bedtime as needed.  30 tablet  none  . amiodarone (PACERONE) 200 MG tablet Take 1 tablet (200 mg total) by mouth daily.  90 tablet  1  . furosemide (LASIX) 20 MG tablet Take 1 tablet (20 mg total) by mouth daily.  90 tablet  1  . ibuprofen (ADVIL,MOTRIN) 600 MG tablet       . levothyroxine (SYNTHROID, LEVOTHROID) 137 MCG tablet Take 1 tablet (137 mcg total) by mouth daily.  90 tablet  1  . nitroGLYCERIN (NITROSTAT) 0.4 MG SL tablet Place 1 tablet (0.4 mg total) under the tongue every 5 (five) minutes as needed.  25 tablet  3  . pantoprazole (PROTONIX) 40 MG tablet Take 1 tablet (40 mg total) by mouth daily.  90 tablet  1  . potassium chloride (MICRO-K) 10 MEQ CR capsule Take 10 mEq by mouth daily.        . temazepam (RESTORIL) 15 MG capsule Take 1 capsule (15 mg total) by mouth at bedtime as needed.  30 capsule  5    Review of Systems  Constitutional: Negative for fever, chills and unexpected weight change.  HENT: Negative for hearing loss, ear pain, nosebleeds, congestion, sore throat, facial swelling, rhinorrhea, sneezing, mouth sores, trouble swallowing, neck pain, neck stiffness, voice change, postnasal drip, sinus pressure, tinnitus and ear discharge.   Eyes:  Negative for pain, discharge, redness and visual disturbance.  Respiratory: Negative for cough, chest tightness, shortness of breath, wheezing and stridor.   Cardiovascular: Negative for chest pain, palpitations and leg swelling.  Musculoskeletal: Negative for myalgias and arthralgias.  Skin: Negative for color change and rash.  Neurological: Negative for dizziness, weakness, light-headedness and headaches.  Hematological: Negative for adenopathy.  Psychiatric/Behavioral: Positive for sleep disturbance and dysphoric mood.   BP 126/64  Pulse 66  Temp(Src) 98.1 F (36.7 C) (Oral)  Resp 16  Ht 5\' 4"  (1.626 m)  Wt 192 lb 4 oz (87.204 kg)  BMI 33.00 kg/m2  SpO2 94%     Objective:   Physical Exam  Constitutional: She is oriented to person, place, and time. She appears well-developed and well-nourished.  HENT:  Mouth/Throat: Oropharynx is clear and moist.  Eyes: EOM are normal. Pupils are equal, round, and reactive to light. No scleral icterus.  Neck: Normal range of motion. Neck supple. No JVD present. No thyromegaly present.  Cardiovascular: Normal rate, regular rhythm, normal heart sounds and intact distal pulses.   Pulmonary/Chest: Effort normal and breath sounds normal.  Abdominal: Soft. Bowel sounds are normal. She exhibits no mass. There is no tenderness.  Musculoskeletal: Normal range of motion. She exhibits no edema.  Lymphadenopathy:    She has no cervical adenopathy.  Neurological: She is alert and oriented to person, place, and time.  Skin: Skin is warm and dry.  Psychiatric: She has a normal mood and affect.       Assessment & Plan:   Depressive disorder Mild, with anhedonia, some sleep disruption, and admitted loneliness.  After 15 minutes of discussion she is willing to try a low dose SSRI for amelioriation of sympomts.  Dicussed risk and benefits of Lexapro, and any anticipated side effects,  Return in one month.  Hypertension Well controlled on current regimen .   No changes today.    Updated Medication List Outpatient Encounter Prescriptions as of 02/19/2011  Medication Sig Dispense Refill  . ALPRAZolam (XANAX) 0.25 MG tablet Take 1 tablet (0.25 mg total) by mouth at bedtime as needed.  30 tablet  none  . amiodarone (PACERONE) 200 MG tablet Take 1 tablet (200 mg total) by mouth daily.  90 tablet  1  . furosemide (LASIX) 20 MG tablet Take 1 tablet (20 mg total) by mouth daily.  90 tablet  1  . hydrochlorothiazide (HYDRODIURIL) 25 MG tablet Take 25 mg by mouth. Once a week on Tuesday       . ibuprofen (ADVIL,MOTRIN) 600 MG tablet       . levothyroxine (SYNTHROID, LEVOTHROID) 137 MCG tablet Take 1 tablet (137 mcg total) by mouth daily.  90 tablet  1  . nitroGLYCERIN (NITROSTAT) 0.4 MG SL tablet Place 1 tablet (0.4 mg total) under the tongue every 5 (five) minutes as needed.  25 tablet  3  . pantoprazole (PROTONIX) 40 MG tablet Take 1 tablet (40 mg total) by mouth daily.  90 tablet  1  . potassium chloride (MICRO-K) 10 MEQ CR capsule Take 10 mEq by mouth daily.        . temazepam (RESTORIL) 15 MG capsule Take 1 capsule (15 mg total) by mouth at bedtime as needed.  30 capsule  5  . DISCONTD: hydrochlorothiazide (HYDRODIURIL) 25 MG tablet Take 1 tablet (25 mg total) by mouth daily.  90 tablet  1  . escitalopram (LEXAPRO) 10 MG tablet Take 1 tablet (10 mg total) by mouth daily.  30 tablet  2  . DISCONTD: clindamycin (CLEOCIN) 150 MG capsule       . DISCONTD: potassium chloride SA (KLOR-CON M20) 20 MEQ tablet Take 0.5 tablets (10 mEq total) by mouth daily.  45 tablet  1

## 2011-02-22 ENCOUNTER — Encounter: Payer: Self-pay | Admitting: *Deleted

## 2011-03-24 ENCOUNTER — Ambulatory Visit (INDEPENDENT_AMBULATORY_CARE_PROVIDER_SITE_OTHER): Payer: Medicare Other | Admitting: Internal Medicine

## 2011-03-24 ENCOUNTER — Encounter: Payer: Self-pay | Admitting: Internal Medicine

## 2011-03-24 DIAGNOSIS — R259 Unspecified abnormal involuntary movements: Secondary | ICD-10-CM

## 2011-03-24 DIAGNOSIS — F32A Depression, unspecified: Secondary | ICD-10-CM

## 2011-03-24 DIAGNOSIS — R251 Tremor, unspecified: Secondary | ICD-10-CM

## 2011-03-24 DIAGNOSIS — F329 Major depressive disorder, single episode, unspecified: Secondary | ICD-10-CM

## 2011-03-24 NOTE — Patient Instructions (Addendum)
For your insomnia we  will try taking the temazepam at 7 Pm   If this does not help, you can double the dose of temezepam to 30 mg daily    Please increase the lexapro to 1 whole tablet daily in the morning .    We are ordering an MRI of your brain to investigate the source of your tremor and will set you up to see a neurologist after the MRI>

## 2011-03-24 NOTE — Progress Notes (Signed)
Subjective:    Patient ID: Grace Bennett, female    DOB: 1918-05-08, 78 y.o.   MRN: [id number]  HPI  Ms Lamour is a 78 yr old white female with a history ofatrial fibrillation , not anticoagualted due to history of GI bleed and recent fall in August 2012 which resulted in superficial scalp laceration returns for one month followup of medication initiated for depression. She has a new complaint of  uncontrolled tremors of both arms, right greater than left, along with head tremor which started a few weeks ago.  Symptoms are not present at rest but with use of arm. The tremor is affecting her ability to cook, dress and feed herself.  Past Medical History  Diagnosis Date  . Anemia   . A-fib   . [place]ath-induced atrial fibrillation   . Pain in joint, pelvic region and thigh   . Paget's disease     of left breast, nipple removed 2005  . H/O: GI bleed     lower while on coumadin, s/p 6 units PRBCs  . Thyroid disease   . Lower GI bleed     hx of while on coumadine..s/p 6 units PRBC'S  . Hypertension    Current Outpatient Prescriptions on File Prior to Visit  Medication Sig Dispense Refill  . ALPRAZolam (XANAX) 0.25 MG tablet Take 1 tablet (0.25 mg total) by mouth at bedtime as needed.  30 tablet  none  . amiodarone (PACERONE) 200 MG tablet Take 1 tablet (200 mg total) by mouth daily.  90 tablet  1  . escitalopram (LEXAPRO) 10 MG tablet Take 1 tablet (10 mg total) by mouth daily.  30 tablet  2  . furosemide (LASIX) 20 MG tablet Take 1 tablet (20 mg total) by mouth daily.  90 tablet  1  . hydrochlorothiazide (HYDRODIURIL) 25 MG tablet Take 25 mg by mouth. Once a week on Tuesday       . ibuprofen (ADVIL,MOTRIN) 600 MG tablet       . levothyroxine (SYNTHROID, LEVOTHROID) 137 MCG tablet Take 1 tablet (137 mcg total) by mouth daily.  90 tablet  1  . nitroGLYCERIN (NITROSTAT) 0.4 MG SL tablet Place 1 tablet (0.4 mg total) under the tongue every 5 (five) minutes as needed.  25 tablet  3  .  pantoprazole (PROTONIX) 40 MG tablet Take 1 tablet (40 mg total) by mouth daily.  90 tablet  1  . potassium chloride (MICRO-K) 10 MEQ CR capsule Take 10 mEq by mouth daily.        . temazepam (RESTORIL) 15 MG capsule Take 1 capsule (15 mg total) by mouth at bedtime as needed.  30 capsule  5    Review of Systems  Constitutional: Negative for fever, chills and unexpected weight change.  HENT: Negative for hearing loss, ear pain, nosebleeds, congestion, sore throat, facial swelling, rhinorrhea, sneezing, mouth sores, trouble swallowing, neck pain, neck stiffness, voice change, postnasal drip, sinus pressure, tinnitus and ear discharge.   Eyes: Negative for pain, discharge, redness and visual disturbance.  Respiratory: Negative for cough, chest tightness, shortness of breath, wheezing and stridor.   Cardiovascular: Positive for chest pain. Negative for palpitations and leg swelling.  Musculoskeletal: Negative for myalgias and arthralgias.  Skin: Negative for color change and rash.  Neurological: Positive for tremors. Negative for dizziness, weakness, light-headedness and headaches.  Hematological: Negative for adenopathy.  Psychiatric/Behavioral: Positive for sleep disturbance.       Objective:   Physical Exam  Constitutional: She is  oriented to person, place, and time. She appears well-developed and well-nourished.  HENT:  Mouth/Throat: Oropharynx is clear and moist.  Eyes: EOM are normal. Pupils are equal, round, and reactive to light. No scleral icterus.  Neck: Normal range of motion. Neck supple. No JVD present. No thyromegaly present.  Cardiovascular: Normal rate, regular rhythm, normal heart sounds and intact distal pulses.   Pulmonary/Chest: Effort normal and breath sounds normal.  Abdominal: Soft. Bowel sounds are normal. She exhibits no mass. There is no tenderness.  Musculoskeletal: Normal range of motion. She exhibits no edema.  Lymphadenopathy:    She has no cervical  adenopathy.  Neurological: She is alert and oriented to person, place, and time. She displays tremor. She exhibits abnormal muscle tone.       Tremor is not present at rest or with passive ROM  Skin: Skin is warm and dry.  Psychiatric: She has a normal mood and affect.          Assessment & Plan:  Tremor:  The tremor is brought on with muscle use none seen with arm at rest.

## 2011-03-24 NOTE — Assessment & Plan Note (Signed)
Etiology unclear Neurological exam is otherwise normal.  Absence at rest may suggest muscle weakness as cause . But she is not on a statin and denies myalgias .  LFTs were normal last month, as was thyroid function.  Will need to rule out CVA  With MRI of brain, followed by neurology evaluation

## 2011-03-24 NOTE — Assessment & Plan Note (Signed)
Improved with initiation of lexapro, but insomnia still a problem.  Will increase lexapro to 10 mg daily and have her take the restoril earllier in the evening

## 2011-03-29 ENCOUNTER — Telehealth: Payer: Self-pay | Admitting: Internal Medicine

## 2011-03-29 NOTE — Telephone Encounter (Signed)
Melissa @ [facility] called she needs authorization number  For the mri appointment schedule for 03/30/11  Insurance is [employer]

## 2011-03-30 ENCOUNTER — Ambulatory Visit: Payer: Self-pay | Admitting: Internal Medicine

## 2011-03-31 ENCOUNTER — Telehealth: Payer: Self-pay | Admitting: Internal Medicine

## 2011-03-31 DIAGNOSIS — R251 Tremor, unspecified: Secondary | ICD-10-CM

## 2011-03-31 NOTE — Telephone Encounter (Signed)
Her brain MRI does not show a recent stroke but has a lot of severe chroinc changes suggestive of prior strokes.  No masses.  I will refer her to neurology

## 2011-03-31 NOTE — Telephone Encounter (Signed)
Advised pt, she will wait for our call regarding neurology referral.

## 2011-05-07 ENCOUNTER — Ambulatory Visit (INDEPENDENT_AMBULATORY_CARE_PROVIDER_SITE_OTHER): Payer: Medicare Other | Admitting: Internal Medicine

## 2011-05-07 ENCOUNTER — Encounter: Payer: Self-pay | Admitting: Internal Medicine

## 2011-05-07 DIAGNOSIS — R251 Tremor, unspecified: Secondary | ICD-10-CM

## 2011-05-07 DIAGNOSIS — F329 Major depressive disorder, single episode, unspecified: Secondary | ICD-10-CM

## 2011-05-07 DIAGNOSIS — F32A Depression, unspecified: Secondary | ICD-10-CM

## 2011-05-07 DIAGNOSIS — R259 Unspecified abnormal involuntary movements: Secondary | ICD-10-CM

## 2011-05-07 MED ORDER — TEMAZEPAM 22.5 MG PO CAPS: 22.5000 mg | ORAL_CAPSULE | Freq: Every evening | ORAL | Status: DC | PRN

## 2011-05-09 ENCOUNTER — Encounter: Payer: Self-pay | Admitting: Internal Medicine

## 2011-05-09 NOTE — Assessment & Plan Note (Signed)
Her limb tremor has markedly improved. Is unclear what has resolved her tremor but is no longer an issue. Her MRI was negative for stroke or She would like to follow up with neurology since she does have an apartment and she is concerned about the development of Parkinson's disease.

## 2011-05-09 NOTE — Progress Notes (Signed)
Subjective:    Patient ID: Grace Bennett, female    DOB: [birth year], 78 y.o.   MRN: [id number]  HPI  Ms. Levada Schilling is a 78 year old white female who was evaluated last month for signs and symptoms consistent with depression and new-onset tremor of both hands. She was having an intentional tremor which is making it difficult for her to button her clothes or eat without spilling food at last visit. Her neurological exam at that time was notable for normal tone and intentional tremor and some symmetric weakness of the upper extremities. She was sent for an MRI which was normal she was referred for neurology to neurology but has not had that appointment yet. She returns today for followup on both issues. She consented to a trial of an SSRI, Lexapro at last visit and has been taking her medication daily without side effects. She presents today for follow up and states that she is generally improved and does feel that she has become more sociable and less aggravated by a minor things in life. She is sleeping well has a good appetite. In fact she is worried about some weight gain as her appetite is improved. She is not exercising regularly. She lives alone she does have a high helper who comes and helps her clean her home. She remains independent of all activities of daily living.  Past Medical History  Diagnosis Date  . Anemia   . A-fib   . Atrial fibrillation   . Pain in joint, pelvic region and thigh   . Paget's disease     of left breast, nipple removed 2005  . H/O: GI bleed     lower while on coumadin, s/p 6 units PRBCs  . Thyroid disease   . Lower GI bleed     hx of while on coumadine..s/p 6 units PRBC'S  . Hypertension    Current Outpatient Prescriptions on File Prior to Visit  Medication Sig Dispense Refill  . ALPRAZolam (XANAX) 0.25 MG tablet Take 1 tablet (0.25 mg total) by mouth at bedtime as needed.  30 tablet  none  . amiodarone (PACERONE) 200 MG tablet Take 1 tablet (200 mg total) by  mouth daily.  90 tablet  1  . escitalopram (LEXAPRO) 10 MG tablet Take 1 tablet (10 mg total) by mouth daily.  30 tablet  2  . furosemide (LASIX) 20 MG tablet Take 1 tablet (20 mg total) by mouth daily.  90 tablet  1  . ibuprofen (ADVIL,MOTRIN) 600 MG tablet       . levothyroxine (SYNTHROID, LEVOTHROID) 137 MCG tablet Take 1 tablet (137 mcg total) by mouth daily.  90 tablet  1  . nitroGLYCERIN (NITROSTAT) 0.4 MG SL tablet Place 1 tablet (0.4 mg total) under the tongue every 5 (five) minutes as needed.  25 tablet  3  . pantoprazole (PROTONIX) 40 MG tablet Take 1 tablet (40 mg total) by mouth daily.  90 tablet  1  . potassium chloride (MICRO-K) 10 MEQ CR capsule Take 10 mEq by mouth daily.          Review of Systems  Constitutional: Negative for fever, chills and unexpected weight change.  HENT: Negative for hearing loss, ear pain, nosebleeds, congestion, sore throat, facial swelling, rhinorrhea, sneezing, mouth sores, trouble swallowing, neck pain, neck stiffness, voice change, postnasal drip, sinus pressure, tinnitus and ear discharge.   Eyes: Negative for pain, discharge, redness and visual disturbance.  Respiratory: Negative for cough, chest tightness, shortness of breath, wheezing and  stridor.   Cardiovascular: Positive for chest pain. Negative for palpitations and leg swelling.  Musculoskeletal: Negative for myalgias and arthralgias.  Skin: Negative for color change and rash.  Neurological: Positive for tremors. Negative for dizziness, weakness, light-headedness and headaches.  Hematological: Negative for adenopathy.  Psychiatric/Behavioral: Positive for sleep disturbance.       Objective:   Physical Exam  Constitutional: She is oriented to person, place, and time. She appears well-developed and well-nourished.  HENT:  Mouth/Throat: Oropharynx is clear and moist.  Eyes: EOM are normal. Pupils are equal, round, and reactive to light. No scleral icterus.  Neck: Normal range of motion.  Neck supple. No JVD present. No thyromegaly present.  Cardiovascular: Normal rate, regular rhythm, normal heart sounds and intact distal pulses.   Pulmonary/Chest: Effort normal and breath sounds normal.  Abdominal: Soft. Bowel sounds are normal. She exhibits no mass. There is no tenderness.  Musculoskeletal: Normal range of motion. She exhibits no edema.  Lymphadenopathy:    She has no cervical adenopathy.  Neurological: She is alert and oriented to person, place, and time. She displays tremor. She exhibits abnormal muscle tone.       Tremor is not present at rest or with passive ROM  Skin: Skin is warm and dry.  Psychiatric: She has a normal mood and affect.          Assessment & Plan:

## 2011-05-09 NOTE — Assessment & Plan Note (Signed)
With social isolation anhedonia and irritability as her major symptoms. Symptoms are now moderately improved with current dose of Lexapro. She would like a man of his medication for the time being. No alterations were made today.

## 2011-05-09 NOTE — Progress Notes (Addendum)
  Subjective:    Patient ID: Grace Bennett, female    DOB: 1918-11-15, 78 y.o.   MRN: [id number]  HPI    Review of Systems     Objective:   Physical Exam        Assessment & Plan:   Limb tremor Her limb tremor has markedly improved. Is unclear what has resolved her tremor but is no longer an issue. Her MRI was negative for stroke or She would like to follow up with neurology since she does have an apartment and she is concerned about the development of Parkinson's disease.  Depressive disorder With social isolation anhedonia and irritability as her major symptoms. Symptoms are now moderately improved with current dose of Lexapro. She would like a man of his medication for the time being. No alterations were made today.    Updated Medication List Outpatient Encounter Prescriptions as of 05/07/2011  Medication Sig Dispense Refill  . ALPRAZolam (XANAX) 0.25 MG tablet Take 1 tablet (0.25 mg total) by mouth at bedtime as needed.  30 tablet  none  . amiodarone (PACERONE) 200 MG tablet Take 1 tablet (200 mg total) by mouth daily.  90 tablet  1  . escitalopram (LEXAPRO) 10 MG tablet Take 1 tablet (10 mg total) by mouth daily.  30 tablet  2  . furosemide (LASIX) 20 MG tablet Take 1 tablet (20 mg total) by mouth daily.  90 tablet  1  . ibuprofen (ADVIL,MOTRIN) 600 MG tablet       . levothyroxine (SYNTHROID, LEVOTHROID) 137 MCG tablet Take 1 tablet (137 mcg total) by mouth daily.  90 tablet  1  . nitroGLYCERIN (NITROSTAT) 0.4 MG SL tablet Place 1 tablet (0.4 mg total) under the tongue every 5 (five) minutes as needed.  25 tablet  3  . pantoprazole (PROTONIX) 40 MG tablet Take 1 tablet (40 mg total) by mouth daily.  90 tablet  1  . potassium chloride (MICRO-K) 10 MEQ CR capsule Take 10 mEq by mouth daily.        Marland Kitchen DISCONTD: hydrochlorothiazide (HYDRODIURIL) 25 MG tablet Take 25 mg by mouth. Once a week on Tuesday       . DISCONTD: temazepam (RESTORIL) 15 MG capsule Take 1 capsule (15 mg  total) by mouth at bedtime as needed.  30 capsule  5  . temazepam (RESTORIL) 22.5 MG capsule Take 1 capsule (22.5 mg total) by mouth at bedtime as needed for sleep.  30 capsule  3

## 2011-05-14 ENCOUNTER — Telehealth: Payer: Self-pay | Admitting: *Deleted

## 2011-05-14 MED ORDER — TEMAZEPAM 30 MG PO CAPS: 30.0000 mg | ORAL_CAPSULE | Freq: Every evening | ORAL | Status: DC | PRN

## 2011-05-14 NOTE — Telephone Encounter (Signed)
Pharmacist states that pt's temazepam dose has been increased to 22.5 mg's, which her daughter says is too expensive.  The 30 mg dose would be less costly and pharmacist is asking if ok to change to that dose.  Please advise.

## 2011-05-14 NOTE — Telephone Encounter (Signed)
Medicine called to pharmacy. 

## 2011-05-14 NOTE — Telephone Encounter (Signed)
Yes, ok to increse dose of temazepam to 30 mg ,  Chart updated.

## 2011-05-17 ENCOUNTER — Other Ambulatory Visit: Payer: Self-pay | Admitting: Internal Medicine

## 2011-05-17 DIAGNOSIS — F329 Major depressive disorder, single episode, unspecified: Secondary | ICD-10-CM

## 2011-05-17 MED ORDER — TEMAZEPAM 15 MG PO CAPS: 15.0000 mg | ORAL_CAPSULE | Freq: Every evening | ORAL | Status: DC | PRN

## 2011-05-17 NOTE — Telephone Encounter (Signed)
Refill for 15 mg authorized and sent

## 2011-05-17 NOTE — Telephone Encounter (Signed)
Patient stated she cant take the 30 mg because it is too strong so she would like a refill on the 15 mg tablet.

## 2011-05-25 ENCOUNTER — Encounter: Payer: Self-pay | Admitting: Internal Medicine

## 2011-06-11 ENCOUNTER — Other Ambulatory Visit: Payer: Self-pay | Admitting: Internal Medicine

## 2011-06-11 MED ORDER — ESCITALOPRAM OXALATE 10 MG PO TABS: 10.0000 mg | ORAL_TABLET | Freq: Every day | ORAL | Status: DC

## 2011-06-13 ENCOUNTER — Emergency Department: Payer: Self-pay | Admitting: Internal Medicine

## 2011-06-13 LAB — COMPREHENSIVE METABOLIC PANEL
BUN: 15 mg/dL (ref 7–18)
Bilirubin,Total: 0.4 mg/dL (ref 0.2–1.0)
Calcium, Total: 8.2 mg/dL — ABNORMAL LOW (ref 8.5–10.1)
Chloride: 105 mmol/L (ref 98–107)
Co2: 30 mmol/L (ref 21–32)
EGFR (African American): >60
Potassium: 3.6 mmol/L (ref 3.5–5.1)
SGPT (ALT): 24 U/L
Total Protein: 7.4 g/dL (ref 6.4–8.2)

## 2011-06-13 LAB — CBC
HCT: 36.7 % (ref 35.0–47.0)
MCH: 27.5 pg (ref 26.0–34.0)
MCHC: 32.4 g/dL (ref 32.0–36.0)
MCV: 85 fL (ref 80–100)
Platelet: 179 10*3/uL (ref 150–440)
RBC: 4.31 10*6/uL (ref 3.80–5.20)
RDW: 15.2 % — ABNORMAL HIGH (ref 11.5–14.5)
WBC: 10.7 10*3/uL (ref 3.6–11.0)

## 2011-06-13 LAB — URINALYSIS, COMPLETE
Bilirubin,UR: NEGATIVE
Nitrite: NEGATIVE
RBC,UR: 1 /HPF (ref 0–5)
Specific Gravity: 1.006 (ref 1.003–1.030)
Squamous Epithelial: <1

## 2011-07-01 ENCOUNTER — Encounter: Payer: Self-pay | Admitting: Internal Medicine

## 2011-07-05 ENCOUNTER — Encounter: Payer: Self-pay | Admitting: Cardiovascular Disease

## 2011-07-05 ENCOUNTER — Ambulatory Visit (INDEPENDENT_AMBULATORY_CARE_PROVIDER_SITE_OTHER): Payer: Medicare Other | Admitting: Cardiovascular Disease

## 2011-07-05 VITALS — BP 138/78 | HR 62 | Ht 65.0 in | Wt 197.0 lb

## 2011-07-05 DIAGNOSIS — I2510 Atherosclerotic heart disease of native coronary artery without angina pectoris: Secondary | ICD-10-CM

## 2011-07-05 DIAGNOSIS — I10 Essential (primary) hypertension: Secondary | ICD-10-CM

## 2011-07-05 DIAGNOSIS — I4891 Unspecified atrial fibrillation: Secondary | ICD-10-CM

## 2011-07-05 DIAGNOSIS — F3289 Other specified depressive episodes: Secondary | ICD-10-CM

## 2011-07-05 DIAGNOSIS — F329 Major depressive disorder, single episode, unspecified: Secondary | ICD-10-CM

## 2011-07-05 DIAGNOSIS — R079 Chest pain, unspecified: Secondary | ICD-10-CM

## 2011-07-05 NOTE — Assessment & Plan Note (Signed)
Currently with no symptoms of angina. No further workup at this time. Continue current medication regimen. 

## 2011-07-05 NOTE — Progress Notes (Signed)
Patient ID: Grace Bennett, female    DOB: 1919-03-12, 78 y.o.   MRN: [id number]  HPI Comments: Grace Bennett is a very pleasant 78 year old woman, patient of Dr. Darrick Huntsman, with a past medical history of atrial fibrillation, coronary artery disease with angioplasty in 1995, mild carotid arterial disease, thyroid cancer with resection currently on thyroid supplementation, insomnia, previous evaluation for chest pain episodes, with chronic general malaise and depression.  She lives alone, tries to stay independent but was unable to drive secondary to macular degeneration. She has had several falls, one significant fall last summer of 2012. She did physical therapy for a short period of time and now does not do any regular exercise. She makes breakfast in the morning which she finds very stressful and tiring. She sits in her recliner chair for long periods at a time. She wishes that she had someone to talk to during the daytime. She does not sleep well.  She does not have any energy during the daytime. No recent significant chest pain.  From her last clinic visit , She is not interested in any cardiac catheterization or stress testing at this time.  EKG shows left bundle branch block with normal sinus rhythm, rate 60 beats per minute   Outpatient Encounter Prescriptions as of 07/05/2011  Medication Sig Dispense Refill  . ALPRAZolam (XANAX) 0.25 MG tablet Take 1 tablet (0.25 mg total) by mouth at bedtime as needed.  30 tablet  none  . amiodarone (PACERONE) 200 MG tablet Take 1 tablet (200 mg total) by mouth daily.  90 tablet  1  . escitalopram (LEXAPRO) 10 MG tablet Take 1 tablet (10 mg total) by mouth daily.  30 tablet  5  . furosemide (LASIX) 20 MG tablet Take 1 tablet (20 mg total) by mouth daily.  90 tablet  1  . hydrochlorothiazide (HYDRODIURIL) 25 MG tablet Take 25 mg by mouth once a week.      . levothyroxine (SYNTHROID, LEVOTHROID) 137 MCG tablet Take 1 tablet (137 mcg total) by mouth daily.  90  tablet  1  . nitroGLYCERIN (NITROSTAT) 0.4 MG SL tablet Place 1 tablet (0.4 mg total) under the tongue every 5 (five) minutes as needed.  25 tablet  3  . pantoprazole (PROTONIX) 40 MG tablet Take 1 tablet (40 mg total) by mouth daily.  90 tablet  1  . potassium chloride (MICRO-K) 10 MEQ CR capsule Take 10 mEq by mouth daily.        . temazepam (RESTORIL) 15 MG capsule Take 15 mg by mouth at bedtime as needed.       Review of Systems  Constitutional: Positive for fatigue.  HENT: Negative.   Eyes: Negative.   Respiratory: Negative.   Cardiovascular: Negative.   Gastrointestinal: Negative.   Musculoskeletal: Positive for gait problem.  Skin: Negative.   Neurological: Positive for weakness.  Hematological: Negative.   Psychiatric/Behavioral: Negative.   All other systems reviewed and are negative.    BP 138/78  Pulse 62  Ht 5\' 5"  (1.651 m)  Wt 197 lb (89.359 kg)  BMI 32.78 kg/m2   Physical Exam  Nursing note and vitals reviewed. Constitutional: She is oriented to person, place, and time. She appears well-developed and well-nourished.       Elderly woman in no apparent distress, walking with a cane  HENT:  Head: Normocephalic.  Nose: Nose normal.  Mouth/Throat: Oropharynx is clear and moist.  Eyes: Conjunctivae are normal. Pupils are equal, round, and reactive to light.  Neck: Normal range of motion. Neck supple. No JVD present.  Cardiovascular: Normal rate, regular rhythm, S1 normal, S2 normal, normal heart sounds and intact distal pulses.  Exam reveals no gallop and no friction rub.   No murmur heard. Pulmonary/Chest: Effort normal and breath sounds normal. No respiratory distress. She has no wheezes. She has no rales. She exhibits no tenderness.  Abdominal: Soft. Bowel sounds are normal. She exhibits no distension. There is no tenderness.  Musculoskeletal: Normal range of motion. She exhibits no edema and no tenderness.  Lymphadenopathy:    She has no cervical adenopathy.    Neurological: She is alert and oriented to person, place, and time. Coordination normal.  Skin: Skin is warm and dry. No rash noted. No erythema.  Psychiatric: She has a normal mood and affect. Her behavior is normal. Judgment and thought content normal.         Assessment and Plan

## 2011-07-05 NOTE — Assessment & Plan Note (Signed)
Maintaining normal sinus rhythm. No changes to her medications 

## 2011-07-05 NOTE — Patient Instructions (Signed)
You are doing well. No medication changes were made.  Please call us if you have new issues that need to be addressed before your next appt.  Your physician wants you to follow-up in: 6 months.  You will receive a reminder letter in the mail two months in advance. If you don't receive a letter, please call our office to schedule the follow-up appointment.   

## 2011-07-05 NOTE — Assessment & Plan Note (Signed)
Blood pressure is well controlled on today's visit. No changes made to the medications. 

## 2011-07-05 NOTE — Assessment & Plan Note (Signed)
She does appear depressed with leg weakness. We have tried to encourage her to stay as active as she can. Overall she is doing well for 78 years old.

## 2011-07-07 ENCOUNTER — Other Ambulatory Visit: Payer: Self-pay | Admitting: Internal Medicine

## 2011-07-07 MED ORDER — PANTOPRAZOLE SODIUM 40 MG PO TBEC: 40.0000 mg | DELAYED_RELEASE_TABLET | Freq: Every day | ORAL | Status: DC

## 2011-07-07 MED ORDER — AMIODARONE HCL 200 MG PO TABS: 200.0000 mg | ORAL_TABLET | Freq: Every day | ORAL | Status: DC

## 2011-07-07 MED ORDER — LEVOTHYROXINE SODIUM 137 MCG PO TABS: 137.0000 ug | ORAL_TABLET | Freq: Every day | ORAL | Status: DC

## 2011-08-09 ENCOUNTER — Ambulatory Visit (INDEPENDENT_AMBULATORY_CARE_PROVIDER_SITE_OTHER): Payer: Medicare Other | Admitting: Internal Medicine

## 2011-08-09 ENCOUNTER — Encounter: Payer: Self-pay | Admitting: Internal Medicine

## 2011-08-09 VITALS — BP 140/74 | HR 61 | Temp 98.5°F | Resp 16 | Ht 65.0 in | Wt 201.5 lb

## 2011-08-09 DIAGNOSIS — R259 Unspecified abnormal involuntary movements: Secondary | ICD-10-CM

## 2011-08-09 DIAGNOSIS — R5383 Other fatigue: Secondary | ICD-10-CM

## 2011-08-09 DIAGNOSIS — F329 Major depressive disorder, single episode, unspecified: Secondary | ICD-10-CM

## 2011-08-09 DIAGNOSIS — R251 Tremor, unspecified: Secondary | ICD-10-CM

## 2011-08-09 DIAGNOSIS — R5381 Other malaise: Secondary | ICD-10-CM

## 2011-08-09 DIAGNOSIS — I10 Essential (primary) hypertension: Secondary | ICD-10-CM

## 2011-08-09 NOTE — Patient Instructions (Signed)
Never never stop your miralax. . You need to take this daily to manage your constipation, along with your stool softener  Use the lactulose if you go 48 hours without a stool.  But do not stop the miralax.   You need to drink 48 ounces of water daily (3 16 ounce bottles of water daily) to help your bowels move.

## 2011-08-09 NOTE — Progress Notes (Signed)
Patient ID: Grace Bennett, female   DOB: 1918-08-11, 78 y.o.   MRN: [id number]  Patient Active Problem List  Diagnoses  . Chest pain  . Malaise  . CAD (coronary artery disease)  . HTN (hypertension)  . A-fib  . Hypertension  . Depressive disorder  . Limb tremor    Subjective:  CC:   Chief Complaint  Patient presents with  . Follow-up    3 month f/u    HPI:   Grace Bennett a 78 y.o. female who presents Follow up on chronic issues.  Multiple complaints.  ER visit for hemorrhoids complicated by patient's suspension of medications for constipation.  Was treated with lactulose and anusol hc suppositories but they are breaking in 2 when she tries to apply them.  Not using her Sitz bath bc it doesn't work with the commode lift. Appetite is improving,  Weight is stable . Not walking much  Or getting out much, but prefers the solitude.  Sleeping well.  No new pains. Some balance issues but no falls.     Past Medical History  Diagnosis Date  . Anemia   . A-fib   . Atrial fibrillation   . Pain in joint, pelvic region and thigh   . Paget's disease     of left breast, nipple removed 2005  . H/O: GI bleed     lower while on coumadin, s/p 6 units PRBCs  . Thyroid disease   . Lower GI bleed     hx of while on coumadine..s/p 6 units PRBC'S  . Hypertension     Past Surgical History  Procedure Date  . Cholecystectomy 1999  . Left renal fixation surgery  1947  . Thyroidectomy   . Fetal blood transfusion     for diverticulitis & GI bleed         The following portions of the patient's history were reviewed and updated as appropriate: Allergies, current medications, and problem list.    Review of Systems:   12 Pt  review of systems was negative except those addressed in the HPI,     History   Social History  . Marital Status: Widowed    Spouse Name: N/A    Number of Children: N/A  . Years of Education: N/A   Occupational History  . Not on file.   Social  History Main Topics  . Smoking status: Never Smoker   . Smokeless tobacco: Never Used  . Alcohol Use: No  . Drug Use: No  . Sexually Active: Not on file   Other Topics Concern  . Not on file   Social History Narrative   Lives alone at retirement community    Objective:  BP 140/74  Pulse 61  Temp(Src) 98.5 F (36.9 C) (Oral)  Resp 16  Ht 5\' 5"  (1.651 m)  Wt 201 lb 8 oz (91.4 kg)  BMI 33.53 kg/m2  SpO2 97%  General appearance: alert, cooperative and appears stated age Ears: normal TM's and external ear canals both ears Throat: lips, mucosa, and tongue normal; teeth and gums normal Neck: no adenopathy, no carotid bruit, supple, symmetrical, trachea midline and thyroid not enlarged, symmetric, no tenderness/mass/nodules Back: symmetric, no curvature. ROM normal. No CVA tenderness. Lungs: clear to auscultation bilaterally Heart: regular rate and rhythm, S1, S2 normal, no murmur, click, rub or gallop Abdomen: soft, non-tender; bowel sounds normal; no masses,  no organomegaly Pulses: 2+ and symmetric Skin: Skin color, texture, turgor normal. No rashes or lesions Lymph  nodes: Cervical, supraclavicular, and axillary nodes normal.  Assessment and Plan:  HTN (hypertension) Well controlled on current medications.  No changes today.  Depressive disorder improved with treatment. No changes today  Malaise Improved but not resolved.  Encouraged more socialization and activities. Not suicidal.   Limb tremor Accompanied by gait instability and loss of center of balance,  All suggestive of Parkinson's disease. Discussed potential diagnosis, neurology evaluation offerred bu declined. She does nt want a trial of medications.     Updated Medication List Outpatient Encounter Prescriptions as of 08/09/2011  Medication Sig Dispense Refill  . amiodarone (PACERONE) 200 MG tablet Take 1 tablet (200 mg total) by mouth daily.  90 tablet  2  . escitalopram (LEXAPRO) 10 MG tablet Take 1  tablet (10 mg total) by mouth daily.  30 tablet  5  . furosemide (LASIX) 20 MG tablet Take 1 tablet (20 mg total) by mouth daily.  90 tablet  1  . hydrochlorothiazide (HYDRODIURIL) 25 MG tablet Take 25 mg by mouth once a week.      . levothyroxine (SYNTHROID, LEVOTHROID) 137 MCG tablet Take 1 tablet (137 mcg total) by mouth daily.  90 tablet  2  . pantoprazole (PROTONIX) 40 MG tablet Take 1 tablet (40 mg total) by mouth daily.  90 tablet  2  . potassium chloride (MICRO-K) 10 MEQ CR capsule Take 10 mEq by mouth daily.        . temazepam (RESTORIL) 15 MG capsule Take 15 mg by mouth at bedtime as needed.      . ALPRAZolam (XANAX) 0.25 MG tablet Take 1 tablet (0.25 mg total) by mouth at bedtime as needed.  30 tablet  none  . nitroGLYCERIN (NITROSTAT) 0.4 MG SL tablet Place 1 tablet (0.4 mg total) under the tongue every 5 (five) minutes as needed.  25 tablet  3  . temazepam (RESTORIL) 15 MG capsule Take 1 capsule (15 mg total) by mouth at bedtime as needed for sleep.  30 capsule  3     No orders of the defined types were placed in this encounter.    No Follow-up on file.      '

## 2011-08-10 ENCOUNTER — Encounter: Payer: Self-pay | Admitting: Internal Medicine

## 2011-08-10 NOTE — Assessment & Plan Note (Signed)
Well controlled on current medications.  No changes today. 

## 2011-08-10 NOTE — Assessment & Plan Note (Signed)
Improved but not resolved.  Encouraged more socialization and activities. Not suicidal.

## 2011-08-10 NOTE — Assessment & Plan Note (Addendum)
Accompanied by gait instability and loss of center of balance,  All suggestive of Parkinson's disease. Discussed potential diagnosis, neurology evaluation offerred bu declined. She does nt want a trial of medications.

## 2011-08-10 NOTE — Assessment & Plan Note (Signed)
improved with treatment. No changes today

## 2011-08-26 ENCOUNTER — Other Ambulatory Visit: Payer: Self-pay | Admitting: Internal Medicine

## 2011-08-27 ENCOUNTER — Other Ambulatory Visit: Payer: Self-pay | Admitting: Internal Medicine

## 2011-08-28 MED ORDER — LACTULOSE 10 GM/15ML PO SOLN: ORAL | Status: DC

## 2011-09-10 ENCOUNTER — Other Ambulatory Visit: Payer: Self-pay | Admitting: Internal Medicine

## 2011-09-13 ENCOUNTER — Other Ambulatory Visit: Payer: Self-pay | Admitting: *Deleted

## 2011-09-13 NOTE — Telephone Encounter (Signed)
rx faxed into pharmacy, [facility]

## 2011-10-05 ENCOUNTER — Other Ambulatory Visit: Payer: Self-pay | Admitting: Internal Medicine

## 2011-10-12 ENCOUNTER — Other Ambulatory Visit: Payer: Self-pay | Admitting: Internal Medicine

## 2011-10-19 ENCOUNTER — Encounter: Payer: Self-pay | Admitting: Internal Medicine

## 2011-12-10 ENCOUNTER — Other Ambulatory Visit: Payer: Self-pay | Admitting: Internal Medicine

## 2011-12-21 ENCOUNTER — Telehealth: Payer: Self-pay | Admitting: Internal Medicine

## 2011-12-21 NOTE — Telephone Encounter (Signed)
Refill request hydrocodone/cpm 10-8 mg/12ml sus ml #75 Sig take one teaspoonful twice daily as needed for cough

## 2011-12-22 NOTE — Telephone Encounter (Signed)
Spoke with patient. She was seen by [facility] and they prescribed cough med and she was needing refill. Advised to call them for refill since Dr. Darrick Huntsman had not prescribed med nor seen her for the illness. Advised she would need appt with Dr. Darrick Huntsman otherwise. She verbalized understanding.

## 2012-01-10 ENCOUNTER — Other Ambulatory Visit: Payer: Self-pay | Admitting: Internal Medicine

## 2012-01-10 NOTE — Telephone Encounter (Signed)
Rx faxed to [facility].

## 2012-01-10 NOTE — Telephone Encounter (Signed)
Ok to refill,  Authorized in epic 

## 2012-01-19 ENCOUNTER — Telehealth: Payer: Self-pay | Admitting: Internal Medicine

## 2012-01-19 NOTE — Telephone Encounter (Signed)
Caller: [facility] Lime; Patient Name: Grace Bennett; PCP: Duncan Dull (Adults only); Best Callback Phone Number: [phone]; Reason for call:  Loss of balance.  States she has been much more wobbly over past month.  She uses her walker or cane in the house, but at times goes outside in the garden without support.  Did fall 01/14/12; states she hurt her back and got hematoma on head.  Examined at [facility] and no further treatment needed at that time.  Family is concerned over her deterioration in balance; moved her appt to 02/01/12, and appt staff recommended she speak with RN.  No recent changes in medications except for new Rx for muscle relaxant and pain medication, but is no longer taking those.  Does complain of dizziness at times.  Per dizziness protocol, emergent symptoms currently denied; advised appt within 24 hours.  No appts available within dispositioned time frame; caller requests appt 01/21/12.  Appt sched 01/21/12 0915 with Dr. Darrick Huntsman; info to office for staff/provider review, as appt outside of recommended parameter.   May reach caller at [phone].

## 2012-01-21 ENCOUNTER — Ambulatory Visit (INDEPENDENT_AMBULATORY_CARE_PROVIDER_SITE_OTHER): Payer: Medicare Other | Admitting: Internal Medicine

## 2012-01-21 ENCOUNTER — Encounter: Payer: Self-pay | Admitting: Internal Medicine

## 2012-01-21 VITALS — BP 112/60 | HR 69 | Temp 97.6°F | Ht 64.0 in | Wt 204.0 lb

## 2012-01-21 DIAGNOSIS — R609 Edema, unspecified: Secondary | ICD-10-CM

## 2012-01-21 DIAGNOSIS — R29818 Other symptoms and signs involving the nervous system: Secondary | ICD-10-CM

## 2012-01-21 DIAGNOSIS — I951 Orthostatic hypotension: Secondary | ICD-10-CM

## 2012-01-21 DIAGNOSIS — Z23 Encounter for immunization: Secondary | ICD-10-CM

## 2012-01-21 DIAGNOSIS — I2510 Atherosclerotic heart disease of native coronary artery without angina pectoris: Secondary | ICD-10-CM

## 2012-01-21 DIAGNOSIS — R2689 Other abnormalities of gait and mobility: Secondary | ICD-10-CM

## 2012-01-21 DIAGNOSIS — E538 Deficiency of other specified B group vitamins: Secondary | ICD-10-CM

## 2012-01-21 LAB — BASIC METABOLIC PANEL
BUN: 16 mg/dL (ref 6–23)
CO2: 32 mEq/L (ref 19–32)
Calcium: 8.2 mg/dL — ABNORMAL LOW (ref 8.4–10.5)
Creatinine, Ser: 1.1 mg/dL (ref 0.4–1.2)
GFR: 47.44 mL/min — ABNORMAL LOW (ref >60.00)
Glucose, Bld: 74 mg/dL (ref 70–99)
Sodium: 141 mEq/L (ref 135–145)

## 2012-01-21 MED ORDER — CYANOCOBALAMIN 1000 MCG/ML IJ SOLN
1000.0000 ug | Freq: Once | INTRAMUSCULAR | Status: AC
  Administered 2012-01-21: 1000 ug via INTRAMUSCULAR

## 2012-01-21 NOTE — Progress Notes (Signed)
Patient ID: Grace Bennett, female   DOB: [birth year], 78 y.o.   MRN: [id number]    Patient Active Problem List  Diagnosis  . Chest pain  . Malaise  . CAD (coronary artery disease)  . HTN (hypertension)  . A-fib  . Hypertension  . Depressive disorder  . Limb tremor  . Orthostasis    Subjective:  CC:   Chief Complaint  Patient presents with  . Dizziness    HPI:   [name] a 78 y.o. female who presents for evaluation after a recent fall.  She fell backward onto cement patio one week ago today.  hematoma on top of scalp.  [facility] evaluated her.  No MRI or CT needed since her neuro exam was normal .  The fall occurred without warning.  No vertigo or dizziness,  Just loses balances .  Uses walker in appt. But was not using it when she went out to get the mop. Fells unsteady when she first stands up.   Past Medical History  Diagnosis Date  . Anemia   . A-fib   . Atrial fibrillation   . Pain in joint, pelvic region and thigh   . Paget's disease     of left breast, nipple removed 2005  . H/O: GI bleed     lower while on coumadin, s/p 6 units PRBCs  . Thyroid disease   . Lower GI bleed     hx of while on coumadine..s/p 6 units PRBC'S  . Hypertension     Past Surgical History  Procedure Date  . Cholecystectomy 1999  . Left renal fixation surgery  1947  . Thyroidectomy   . Fetal blood transfusion     for diverticulitis & GI bleed         The following portions of the patient's history were reviewed and updated as appropriate: Allergies, current medications, and problem list.    Review of Systems:   12 Pt  review of systems was negative except those addressed in the HPI,     History   Social History  . Marital Status: Widowed    Spouse Name: N/A    Number of Children: N/A  . Years of Education: N/A   Occupational History  . Not on file.   Social History Main Topics  . Smoking status: Never Smoker   . Smokeless tobacco: Never Used  . Alcohol  Use: No  . Drug Use: No  . Sexually Active: Not on file   Other Topics Concern  . Not on file   Social History Narrative   Lives alone at retirement community    Objective:  BP 112/60  Pulse 69  Temp 97.6 F (36.4 C) (Oral)  Ht 5\' 4"  (1.626 m)  Wt 204 lb (92.534 kg)  BMI 35.02 kg/m2  SpO2 95%  General appearance: alert, cooperative and appears stated age Ears: normal TM's and external ear canals both ears Throat: lips, mucosa, and tongue normal; teeth and gums normal Neck: no adenopathy, no carotid bruit, supple, symmetrical, trachea midline and thyroid not enlarged, symmetric, no tenderness/mass/nodules Back: symmetric, no curvature. ROM normal. No CVA tenderness. Lungs: clear to auscultation bilaterally Heart: regular rate and rhythm, S1, S2 normal, no murmur, click, rub or gallop Abdomen: soft, non-tender; bowel sounds normal; no masses,  no organomegaly Pulses: 2+ and symmetric Skin: Skin color, texture, turgor normal. No rashes or lesions Lymph nodes: Cervical, supraclavicular, and axillary nodes normal. Neuro: CNs 2-12 intact , gait normal, no  proprioceptive loss, negative Romberg ,  Cerebellar function intact.   Assessment and Plan:  Orthostasis Systolic pressure dropped from 140 to 110 with upright position. Will rule out adrenal insufficiency, dehydration , suspend her diuretics and recommending use of compression stockings. vascukar ultrasounds ordered. Home PT evaluation to work on balance    Updated Medication List Outpatient Encounter Prescriptions as of 01/21/2012  Medication Sig Dispense Refill  . ALPRAZolam (XANAX) 0.25 MG tablet Take 1 tablet (0.25 mg total) by mouth at bedtime as needed.  30 tablet  none  . amiodarone (PACERONE) 200 MG tablet Take 1 tablet (200 mg total) by mouth daily.  90 tablet  2  . cyanocobalamin (,VITAMIN B-12,) 1000 MCG/ML injection INJECT IM ONCE A MONTH  12 mL  11  . escitalopram (LEXAPRO) 10 MG tablet TAKE ONE (1) TABLET  EACH DAY  30 tablet  5  . KLOR-CON 10 10 MEQ tablet TAKE ONE (1) TABLET EACH DAY  90 each  3  . lactulose (CHRONULAC) 10 GM/15ML solution Take two tablespoonfuls every day if no bowel movement  240 mL  0  . levothyroxine (SYNTHROID, LEVOTHROID) 137 MCG tablet Take 1 tablet (137 mcg total) by mouth daily.  90 tablet  2  . nitroGLYCERIN (NITROSTAT) 0.4 MG SL tablet Place 1 tablet (0.4 mg total) under the tongue every 5 (five) minutes as needed.  25 tablet  3  . pantoprazole (PROTONIX) 40 MG tablet Take 1 tablet (40 mg total) by mouth daily.  90 tablet  2  . potassium chloride (MICRO-K) 10 MEQ CR capsule Take 10 mEq by mouth daily.        . temazepam (RESTORIL) 15 MG capsule TAKE ONE CAPSULE AT BEDTIME AS NEEDED   FOR SLEEP  30 capsule  3  . [DISCONTINUED] furosemide (LASIX) 20 MG tablet TAKE 1 TABLET BY MOUTH EVERY DAY.  90 tablet  3  . [DISCONTINUED] hydrochlorothiazide (HYDRODIURIL) 25 MG tablet Take 25 mg by mouth once a week.      . escitalopram (LEXAPRO) 10 MG tablet Take 1 tablet (10 mg total) by mouth daily.  30 tablet  2  . escitalopram (LEXAPRO) 10 MG tablet Take 1 tablet (10 mg total) by mouth daily.  30 tablet  5  . [EXPIRED] cyanocobalamin ((VITAMIN B-12)) injection 1,000 mcg          Orders Placed This Encounter  Procedures  . Flu vaccine greater than or equal to 3yo preservative free IM  . Cortisol  . Vitamin B12  . Basic metabolic panel  . Ambulatory referral to Vascular Surgery  . Ambulatory referral to Home Health    No Follow-up on file.

## 2012-01-21 NOTE — Patient Instructions (Addendum)
Stop the daily furosemide.  It is dehydrating you and dropping your blood pressure when you stand up.   I would like you to wear knee high graduated compression stockings during the day  To help keep your blood from pooling in your feet and causing swelling  Do not go outside without your walker   We will send you for an evaluation of your veins with Dr. Wyn Quaker   We will make a referral for home PT to work on your balance.

## 2012-01-23 ENCOUNTER — Encounter: Payer: Self-pay | Admitting: Internal Medicine

## 2012-01-23 NOTE — Assessment & Plan Note (Addendum)
Systolic pressure dropped from 140 to 110 with upright position. Will rule out adrenal insufficiency, dehydration , suspend her diuretics and recommending use of compression stockings. vascukar ultrasounds ordered. Home PT evaluation to work on balance

## 2012-01-24 ENCOUNTER — Encounter: Payer: Self-pay | Admitting: Cardiovascular Disease

## 2012-01-24 ENCOUNTER — Ambulatory Visit (INDEPENDENT_AMBULATORY_CARE_PROVIDER_SITE_OTHER): Payer: Medicare Other | Admitting: Cardiovascular Disease

## 2012-01-24 VITALS — BP 151/79 | HR 69 | Ht 64.0 in | Wt 206.2 lb

## 2012-01-24 DIAGNOSIS — I951 Orthostatic hypotension: Secondary | ICD-10-CM

## 2012-01-24 DIAGNOSIS — I10 Essential (primary) hypertension: Secondary | ICD-10-CM

## 2012-01-24 DIAGNOSIS — I4891 Unspecified atrial fibrillation: Secondary | ICD-10-CM

## 2012-01-24 DIAGNOSIS — F329 Major depressive disorder, single episode, unspecified: Secondary | ICD-10-CM

## 2012-01-24 DIAGNOSIS — I2510 Atherosclerotic heart disease of native coronary artery without angina pectoris: Secondary | ICD-10-CM

## 2012-01-24 DIAGNOSIS — R079 Chest pain, unspecified: Secondary | ICD-10-CM

## 2012-01-24 MED ORDER — NITROGLYCERIN 0.4 MG SL SUBL: 0.4000 mg | SUBLINGUAL_TABLET | SUBLINGUAL | Status: AC | PRN

## 2012-01-24 NOTE — Assessment & Plan Note (Signed)
Maintaining normal sinus rhythm 

## 2012-01-24 NOTE — Progress Notes (Signed)
Patient ID: Grace Bennett, female    DOB: 25-Nov-1918, 78 y.o.   MRN: [id number]  HPI Comments: Grace Bennett is a very pleasant 78 year old woman, patient of Dr. Darrick Huntsman, with a past medical history of atrial fibrillation, coronary artery disease with angioplasty in 1995, mild carotid arterial disease, thyroid cancer with resection currently on thyroid supplementation, insomnia,   chest pain episodes, chronic general malaise and depression, gait instability who presents for routine followup.  She reports that she had a recent fall 2 weeks ago. She was outside trying to get the mild and she fell and hit her head. A gentleman came along and helped her back into the house to a chair. she was unable to get out of the chair. She was taken to urgent care for evaluation. No CT or head MRI. She lives alone, tries to stay independent but is unable to drive secondary to macular degeneration.  She does not do any regular exercise.  She sits in her recliner chair for long periods at a time.   She does not have any energy during the daytime. No recent significant chest pain.  From her last clinic visit , She is not interested in any cardiac catheterization or stress testing at this time. No recent use of nitroglycerin. Lasix was recently held for drop in her blood pressure from 140-110 systolic  EKG shows left bundle branch block with normal sinus rhythm, rate 69 beats per minute   Outpatient Encounter Prescriptions as of 01/24/2012  Medication Sig Dispense Refill  . ALPRAZolam (XANAX) 0.25 MG tablet Take 1 tablet (0.25 mg total) by mouth at bedtime as needed.  30 tablet  none  . amiodarone (PACERONE) 200 MG tablet Take 1 tablet (200 mg total) by mouth daily.  90 tablet  2  . cyanocobalamin (,VITAMIN B-12,) 1000 MCG/ML injection INJECT IM ONCE A MONTH  12 mL  11  . escitalopram (LEXAPRO) 10 MG tablet TAKE ONE (1) TABLET EACH DAY  30 tablet  5  . KLOR-CON 10 10 MEQ tablet TAKE ONE (1) TABLET EACH DAY  90 each   3  . lactulose (CHRONULAC) 10 GM/15ML solution Take two tablespoonfuls every day if no bowel movement  240 mL  0  . levothyroxine (SYNTHROID, LEVOTHROID) 137 MCG tablet Take 1 tablet (137 mcg total) by mouth daily.  90 tablet  2  . nitroGLYCERIN (NITROSTAT) 0.4 MG SL tablet Place 1 tablet (0.4 mg total) under the tongue every 5 (five) minutes as needed.  25 tablet  3  . pantoprazole (PROTONIX) 40 MG tablet Take 1 tablet (40 mg total) by mouth daily.  90 tablet  2  . potassium chloride (MICRO-K) 10 MEQ CR capsule Take 10 mEq by mouth daily.        . temazepam (RESTORIL) 15 MG capsule TAKE ONE CAPSULE AT BEDTIME AS NEEDED   FOR SLEEP  30 capsule  3  . [DISCONTINUED] escitalopram (LEXAPRO) 10 MG tablet Take 1 tablet (10 mg total) by mouth daily.  30 tablet  2  . [DISCONTINUED] escitalopram (LEXAPRO) 10 MG tablet Take 1 tablet (10 mg total) by mouth daily.  30 tablet  5    Review of Systems  Constitutional: Positive for fatigue.  HENT: Negative.   Eyes: Negative.   Respiratory: Negative.   Cardiovascular: Negative.   Gastrointestinal: Negative.   Musculoskeletal: Positive for gait problem.  Skin: Negative.   Neurological: Positive for weakness.  Hematological: Negative.   Psychiatric/Behavioral: Negative.   All other  systems reviewed and are negative.    BP 151/79  Pulse 69  Ht 5\' 4"  (1.626 m)  Wt 206 lb 4 oz (93.554 kg)  BMI 35.40 kg/m2  Physical Exam  Nursing note and vitals reviewed. Constitutional: She is oriented to person, place, and time. She appears well-developed and well-nourished.       Elderly woman in no apparent distress, walking with a cane  HENT:  Head: Normocephalic.  Nose: Nose normal.  Mouth/Throat: Oropharynx is clear and moist.  Eyes: Conjunctivae normal are normal. Pupils are equal, round, and reactive to light.  Neck: Normal range of motion. Neck supple. No JVD present.  Cardiovascular: Normal rate, regular rhythm, S1 normal, S2 normal, normal heart  sounds and intact distal pulses.  Exam reveals no gallop and no friction rub.   No murmur heard. Pulmonary/Chest: Effort normal and breath sounds normal. No respiratory distress. She has no wheezes. She has no rales. She exhibits no tenderness.  Abdominal: Soft. Bowel sounds are normal. She exhibits no distension. There is no tenderness.  Musculoskeletal: Normal range of motion. She exhibits no edema and no tenderness.  Lymphadenopathy:    She has no cervical adenopathy.  Neurological: She is alert and oriented to person, place, and time. Coordination normal.  Skin: Skin is warm and dry. No rash noted. No erythema.  Psychiatric: She has a normal mood and affect. Her behavior is normal. Judgment and thought content normal.         Assessment and Plan

## 2012-01-24 NOTE — Assessment & Plan Note (Signed)
Blood pressure is well controlled on today's visit. No changes made to the medications. 

## 2012-01-24 NOTE — Assessment & Plan Note (Signed)
We did discuss chest pain. She has not had any recent nitroglycerin. Very vague atypical type symptoms. No further workup at this time.

## 2012-01-24 NOTE — Assessment & Plan Note (Signed)
Lasix on hold given recent orthostasis. No significant worsening of her edema. Continues to have gait instability. PT ordered.

## 2012-01-24 NOTE — Patient Instructions (Addendum)
No medication changes were made.  Please take potassium 2 tabs daily for three days Then decrease to one potassium a day  Please call us if you have new issues that need to be addressed before your next appt.  Your physician wants you to follow-up in: 6 months.  You will receive a reminder letter in the mail two months in advance. If you don't receive a letter, please call our office to schedule the follow-up appointment.

## 2012-01-24 NOTE — Assessment & Plan Note (Signed)
We have encouraged her to participate in physical therapy. I suspect some of her symptoms are secondary to underlying depression.

## 2012-02-01 ENCOUNTER — Ambulatory Visit: Payer: Medicare Other | Admitting: Internal Medicine

## 2012-02-02 ENCOUNTER — Telehealth: Payer: Self-pay

## 2012-02-02 NOTE — Telephone Encounter (Signed)
Request  a new order for physical therapy in order for the insurance company  to accept. They need something with your signature on it.

## 2012-02-07 ENCOUNTER — Telehealth: Payer: Self-pay

## 2012-02-07 NOTE — Telephone Encounter (Signed)
Darl Pikes notified of verbal order for Occupational therapy and social worker.

## 2012-02-07 NOTE — Telephone Encounter (Signed)
Yes, please give order for OT and Social work.

## 2012-02-07 NOTE — Telephone Encounter (Signed)
Grace Bennett from [facility] wants to know if she can get a verbal order for Occupational therapy and social worker. She will see patient 1 time a week for 5 weeks.

## 2012-02-09 ENCOUNTER — Ambulatory Visit: Payer: Medicare Other | Admitting: Internal Medicine

## 2012-02-22 ENCOUNTER — Telehealth: Payer: Self-pay | Admitting: Internal Medicine

## 2012-02-22 NOTE — Telephone Encounter (Signed)
Caller: Marie/Other; Phone: [phone]; Reason for Call: RN home care calling about wanting order for speech therapy.  States speech therapy may have some techniques/compensatory strategies for swallowing difficulties.  Also has some concerns about possible mild positional vertigo; patient did get dizzy from standing up from the toilet and fell back against the toilet 02/20/12.  States no apparent injury on assessment, but wanted Dr.  Darrick Huntsman to be aware.  Unable to triage patient currently; info to office for provider review/speech therapy consideration/callback.  May reach RN at [phone].  Krs/can

## 2012-02-23 NOTE — Telephone Encounter (Signed)
Her dizziness is a chronic problem and has been evaluated,  Which is why she is no longer on lasix bc she was orthostatic.  home PT was ordered for work on balance several months ago.  Please find out if they have evaluated her.

## 2012-02-24 NOTE — Telephone Encounter (Signed)
Spoke to [place] at [employer]. Verbal order was given for Speech Therapist.

## 2012-02-24 NOTE — Telephone Encounter (Signed)
Yes, verbal order for home speech therapy for swallowing difficulties

## 2012-02-24 NOTE — Telephone Encounter (Signed)
Spoke to Hilda Lias gave her instructions as directed, she wants to know if she can get an order for Speech Therapy because patient will be discharged today and she wanted to get speech therapy in starting next week.

## 2012-03-02 ENCOUNTER — Other Ambulatory Visit: Payer: Self-pay

## 2012-03-02 MED ORDER — HYDROCHLOROTHIAZIDE 25 MG PO TABS: 25.0000 mg | ORAL_TABLET | Freq: Every day | ORAL | Status: DC

## 2012-03-02 NOTE — Telephone Encounter (Signed)
Refill request for Xanax 0.25 mg. And Klor Con 10 meq.(PLEASE ADVISE  Patient request it in liquid).

## 2012-03-02 NOTE — Telephone Encounter (Signed)
Hydrochlorothiazide 25 mg # 90 1 R sent telectronic to [facility].

## 2012-03-03 MED ORDER — POTASSIUM CHLORIDE ER 10 MEQ PO TBCR: 20.0000 meq | EXTENDED_RELEASE_TABLET | Freq: Every day | ORAL | Status: DC

## 2012-03-03 MED ORDER — ALPRAZOLAM 0.25 MG PO TABS: 0.2500 mg | ORAL_TABLET | Freq: Every evening | ORAL | Status: DC | PRN

## 2012-04-13 ENCOUNTER — Other Ambulatory Visit: Payer: Self-pay | Admitting: Internal Medicine

## 2012-04-14 NOTE — Telephone Encounter (Signed)
Med filled.  

## 2012-04-25 ENCOUNTER — Encounter: Payer: Self-pay | Admitting: Internal Medicine

## 2012-04-25 ENCOUNTER — Ambulatory Visit (INDEPENDENT_AMBULATORY_CARE_PROVIDER_SITE_OTHER): Payer: Medicare Other | Admitting: Internal Medicine

## 2012-04-25 VITALS — BP 136/72 | HR 69 | Temp 97.5°F | Resp 16 | Wt 205.8 lb

## 2012-04-25 DIAGNOSIS — R5383 Other fatigue: Secondary | ICD-10-CM

## 2012-04-25 DIAGNOSIS — R5381 Other malaise: Secondary | ICD-10-CM

## 2012-04-25 DIAGNOSIS — I10 Essential (primary) hypertension: Secondary | ICD-10-CM

## 2012-04-25 DIAGNOSIS — I4891 Unspecified atrial fibrillation: Secondary | ICD-10-CM

## 2012-04-25 DIAGNOSIS — I951 Orthostatic hypotension: Secondary | ICD-10-CM

## 2012-04-25 DIAGNOSIS — J069 Acute upper respiratory infection, unspecified: Secondary | ICD-10-CM

## 2012-04-25 NOTE — Assessment & Plan Note (Signed)
Improved transiently with PT. Encouraged to continue home exercises.

## 2012-04-25 NOTE — Assessment & Plan Note (Signed)
Currently viral given the mild HEENT  symptoms  I have explained that in viral URIS, an antibiotic will not help the symptoms and will increase the risk of developing diarrhea.,  Continue oral and nasal decongestants,  Ibuprofen 400 mg and tylenol 650 mq 8 hrs for aches and pains,  Delsym OTC for cough,  Antibiotics only if fevers, severe facial or ear pain pr grossly purulent nasal discharge or sputum develop.  

## 2012-04-25 NOTE — Assessment & Plan Note (Signed)
Still present.  Stopping hct.  Reminded to wear compression stockings.

## 2012-04-25 NOTE — Assessment & Plan Note (Signed)
Well controlled on current regimen. Renal function stable, no changes today. 

## 2012-04-25 NOTE — Patient Instructions (Addendum)
Please wear your Lifeline ALL THE TIME!!!  Try Delsym for the cough;  It is nonsedating and very strong   Try flushing your sinuses twice daily with Simply Saline  (OTC)  Please keep doing the exercises that the Physical Therapist gave you to do EVERY DAY     Stop the HCTZ to see if it is making you weak and making your dry mouth worse   I will send your EKG to Dr. Mariah Milling

## 2012-04-25 NOTE — Progress Notes (Signed)
Patient ID: Grace Bennett, female   DOB: 17-Jun-1918, 79 y.o.   MRN: [id number]  Patient Active Problem List  Diagnosis  . Chest pain  . Malaise  . CAD (coronary artery disease)  . HTN (hypertension)  . A-fib  . Hypertension  . Depressive disorder  . Limb tremor  . Orthostasis  . URI (upper respiratory infection)    Subjective:  CC:   Chief Complaint  Patient presents with  . Follow-up    HPI:   Grace Bennett a 79 y.o. female who presents with malaise.  "i dont' feel good at all."  Dry mouth,  Cough nonproductive, occurs while eating if she doesn't moisten her food.  Has 2 caregivers now for meals and chores,  M W F  And family looks in on her on Tuesday morning ,  Not wearing the lifeline   PT came to the house 3 days per week for 4 weeks. She is not doing the exercises anymore.  Not drinkign enough water  Had some palpitations yesterday.,  None today,  Requesting EKG     Past Medical History  Diagnosis Date  . Anemia   . A-fib   . Atrial fibrillation   . Pain in joint, pelvic region and thigh   . Paget's disease     of left breast, nipple removed 2005  . H/O: GI bleed     lower while on coumadin, s/p 6 units PRBCs  . Thyroid disease   . Lower GI bleed     hx of while on coumadine..s/p 6 units PRBC'S  . Hypertension     Past Surgical History  Procedure Date  . Cholecystectomy 1999  . Left renal fixation surgery  1947  . Thyroidectomy   . Fetal blood transfusion     for diverticulitis & GI bleed         The following portions of the patient's history were reviewed and updated as appropriate: Allergies, current medications, and problem list.    Review of Systems:   12 Pt  review of systems was negative except those addressed in the HPI,     History   Social History  . Marital Status: Widowed    Spouse Name: N/A    Number of Children: N/A  . Years of Education: N/A   Occupational History  . Not on file.   Social History Main Topics   . Smoking status: Never Smoker   . Smokeless tobacco: Never Used  . Alcohol Use: No  . Drug Use: No  . Sexually Active: Not on file   Other Topics Concern  . Not on file   Social History Narrative   Lives alone at retirement community    Objective:  BP 136/72  Pulse 69  Temp 97.5 F (36.4 C) (Oral)  Resp 16  Wt 205 lb 12 oz (93.328 kg)  SpO2 92%  General appearance: alert, cooperative and appears stated age Ears: normal TM's and external ear canals both ears Throat: lips, mucosa, and tongue normal; teeth and gums normal Neck: no adenopathy, no carotid bruit, supple, symmetrical, trachea midline and thyroid not enlarged, symmetric, no tenderness/mass/nodules Back: symmetric, no curvature. ROM normal. No CVA tenderness. Lungs: clear to auscultation bilaterally Heart: regular rate and rhythm, S1, S2 normal, no murmur, click, rub or gallop Abdomen: soft, non-tender; bowel sounds normal; no masses,  no organomegaly Pulses: 2+ and symmetric Skin: Skin color, texture, turgor normal. No rashes or lesions Lymph nodes: Cervical, supraclavicular, and axillary nodes  normal.  Assessment and Plan:  Orthostasis Still present.  Stopping hct.  Reminded to wear compression stockings.   Malaise Improved transiently with PT. Encouraged to continue home exercises.   Hypertension Well controlled on current regimen. Renal function stable, no changes today.  URI (upper respiratory infection) Currently viral given the mild HEENT  symptoms  I have explained that in viral URIS, an antibiotic will not help the symptoms and will increase the risk of developing diarrhea.,  Continue oral and nasal decongestants,  Ibuprofen 400 mg and tylenol 650 mq 8 hrs for aches and pains,  Delsym OTC for cough,  Antibiotics only if fevers, severe facial or ear pain pr grossly purulent nasal discharge or sputum develop.    Updated Medication List Outpatient Encounter Prescriptions as of 04/25/2012  Medication  Sig Dispense Refill  . ALPRAZolam (XANAX) 0.25 MG tablet Take 1 tablet (0.25 mg total) by mouth at bedtime as needed.  30 tablet  1  . amiodarone (PACERONE) 200 MG tablet TAKE ONE (1) TABLET EACH DAY  90 tablet  2  . cyanocobalamin (,VITAMIN B-12,) 1000 MCG/ML injection INJECT IM ONCE A MONTH  12 mL  11  . escitalopram (LEXAPRO) 10 MG tablet TAKE ONE (1) TABLET EACH DAY  30 tablet  5  . hydrochlorothiazide (HYDRODIURIL) 25 MG tablet Take 1 tablet (25 mg total) by mouth daily.  90 tablet  1  . lactulose (CHRONULAC) 10 GM/15ML solution Take two tablespoonfuls every day if no bowel movement  240 mL  0  . levothyroxine (SYNTHROID, LEVOTHROID) 137 MCG tablet TAKE ONE (1) TABLET EACH DAY  90 tablet  2  . nitroGLYCERIN (NITROSTAT) 0.4 MG SL tablet Place 1 tablet (0.4 mg total) under the tongue every 5 (five) minutes as needed.  25 tablet  6  . pantoprazole (PROTONIX) 40 MG tablet TAKE ONE (1) TABLET EACH DAY  90 tablet  2  . potassium chloride (KLOR-CON 10) 10 MEQ tablet Take 2 tablets (20 mEq total) by mouth daily.  90 tablet  3  . potassium chloride (MICRO-K) 10 MEQ CR capsule Take 10 mEq by mouth daily.        . temazepam (RESTORIL) 15 MG capsule TAKE ONE CAPSULE AT BEDTIME AS NEEDED   FOR SLEEP  30 capsule  3     Orders Placed This Encounter  Procedures  . EKG 12-Lead    Return in about 3 months (around 07/23/2012).

## 2012-05-15 ENCOUNTER — Other Ambulatory Visit: Payer: Self-pay | Admitting: *Deleted

## 2012-05-16 ENCOUNTER — Telehealth: Payer: Self-pay

## 2012-05-16 ENCOUNTER — Emergency Department: Payer: Self-pay | Admitting: Internal Medicine

## 2012-05-16 LAB — CBC
HGB: 12.1 g/dL (ref 12.0–16.0)
MCH: 28.3 pg (ref 26.0–34.0)
MCHC: 31.9 g/dL — ABNORMAL LOW (ref 32.0–36.0)
Platelet: 175 10*3/uL (ref 150–440)
RBC: 4.27 10*6/uL (ref 3.80–5.20)
WBC: 8.5 10*3/uL (ref 3.6–11.0)

## 2012-05-16 LAB — COMPREHENSIVE METABOLIC PANEL
Albumin: 3.2 g/dL — ABNORMAL LOW (ref 3.4–5.0)
Alkaline Phosphatase: 84 U/L (ref 50–136)
Anion Gap: 7 (ref 7–16)
BUN: 14 mg/dL (ref 7–18)
Bilirubin,Total: 0.5 mg/dL (ref 0.2–1.0)
Calcium, Total: 7.9 mg/dL — ABNORMAL LOW (ref 8.5–10.1)
Co2: 29 mmol/L (ref 21–32)
Creatinine: 0.94 mg/dL (ref 0.60–1.30)
EGFR (African American): >60
EGFR (Non-African Amer.): 53 — ABNORMAL LOW
Osmolality: 279 (ref 275–301)
Potassium: 3.8 mmol/L (ref 3.5–5.1)
SGPT (ALT): 14 U/L (ref 12–78)
Total Protein: 6.8 g/dL (ref 6.4–8.2)

## 2012-05-16 LAB — TROPONIN I: Troponin-I: <0.02 ng/mL

## 2012-05-16 LAB — CK TOTAL AND CKMB (NOT AT ARMC): CK-MB: <0.5 ng/mL — ABNORMAL LOW (ref 0.5–3.6)

## 2012-05-16 MED ORDER — TEMAZEPAM 15 MG PO CAPS: ORAL_CAPSULE | ORAL | Status: DC

## 2012-05-16 NOTE — Telephone Encounter (Signed)
Pt having SOB with severe palps and chest tightness. Pt has taken 1 nitro this am.

## 2012-05-16 NOTE — Telephone Encounter (Signed)
Pt calling in to say she has been having chest tightness and chest pain associated with bilateral arm painsince last night Pt is audibly dyspneic on the phone and she tells me she is having to sit in her lift chair to help her breathe while she talks with me I advised her to call 911 now She will do this

## 2012-05-16 NOTE — Telephone Encounter (Signed)
Restoril refill request. Pt last seen on 04/25/12. Med last filled on 10/21 #30 with 3 refills.

## 2012-05-17 ENCOUNTER — Telehealth: Payer: Self-pay | Admitting: Internal Medicine

## 2012-05-17 NOTE — Telephone Encounter (Signed)
Thursday at  11:45 it will be a very short visit because we are working her in .

## 2012-05-17 NOTE — Telephone Encounter (Signed)
Please advise scheduler when patient can be worked in.

## 2012-05-17 NOTE — Telephone Encounter (Signed)
Pt needing hospital f/u after ER visit, treated for congestive heart failure.  Pt was given Lasix 10 mg once a day for today and tomorrow but needs f/u with Dr. Darrick Huntsman immediately to determine if she needs to stay on the Lasix.  Dr. Darrick Huntsman has no openings.  Please advise.

## 2012-05-17 NOTE — Telephone Encounter (Signed)
Patient's daughter Nelma Rothman) advised as instructed. Was advised by daughter that she is not sure that she can get her to the office at this time Thursday. Advised her to call back and speak with the scheduler and let her know if patient can come at 11:45.

## 2012-05-17 NOTE — Telephone Encounter (Signed)
Tomorrow at 11:45 thursday

## 2012-05-18 ENCOUNTER — Telehealth: Payer: Self-pay

## 2012-05-18 NOTE — Telephone Encounter (Signed)
No TCM ER visit only

## 2012-05-18 NOTE — Telephone Encounter (Signed)
Message copied by [facility], Minh Roanhorse E on Thu May 18, 2012  8:14 AM ------      Message from: Thersa Salt      Created: Thu May 18, 2012  8:09 AM      Regarding: tcm       appt tomorrow with Mariah Milling ------

## 2012-05-18 NOTE — Telephone Encounter (Signed)
Spoke with Grace Bennett and she stated she would not be able to get [name] in today @ 11:45.

## 2012-05-18 NOTE — Telephone Encounter (Signed)
TCM  

## 2012-05-19 ENCOUNTER — Encounter: Payer: Self-pay | Admitting: Cardiovascular Disease

## 2012-05-19 ENCOUNTER — Ambulatory Visit (INDEPENDENT_AMBULATORY_CARE_PROVIDER_SITE_OTHER): Payer: Medicare Other | Admitting: Cardiovascular Disease

## 2012-05-19 VITALS — BP 146/75 | HR 61 | Ht 66.0 in | Wt 207.0 lb

## 2012-05-19 DIAGNOSIS — I4891 Unspecified atrial fibrillation: Secondary | ICD-10-CM

## 2012-05-19 DIAGNOSIS — R079 Chest pain, unspecified: Secondary | ICD-10-CM

## 2012-05-19 DIAGNOSIS — R609 Edema, unspecified: Secondary | ICD-10-CM

## 2012-05-19 DIAGNOSIS — R259 Unspecified abnormal involuntary movements: Secondary | ICD-10-CM

## 2012-05-19 DIAGNOSIS — R251 Tremor, unspecified: Secondary | ICD-10-CM

## 2012-05-19 DIAGNOSIS — I2510 Atherosclerotic heart disease of native coronary artery without angina pectoris: Secondary | ICD-10-CM

## 2012-05-19 DIAGNOSIS — I10 Essential (primary) hypertension: Secondary | ICD-10-CM

## 2012-05-19 MED ORDER — FUROSEMIDE 20 MG PO TABS: 20.0000 mg | ORAL_TABLET | Freq: Every day | ORAL | Status: DC | PRN

## 2012-05-19 NOTE — Assessment & Plan Note (Signed)
Significant gait instability, prior falls. She walks with a walker

## 2012-05-19 NOTE — Assessment & Plan Note (Signed)
She does report having one episode of tachycardia lasting 5 minutes. This resolved without intervention. We have asked her to call us if she has additional episodes of any length

## 2012-05-19 NOTE — Assessment & Plan Note (Signed)
Blood pressure is well controlled on today's visit. No changes made to the medications. 

## 2012-05-19 NOTE — Assessment & Plan Note (Signed)
One episode of chest pain recently. We've asked her to take nitroglycerin when necessary for additional episodes. She does not want any workup

## 2012-05-19 NOTE — Assessment & Plan Note (Signed)
I've asked her to use her Lasix very sparingly, only for severe leg swelling as she has had low blood pressure on routine Lasix use

## 2012-05-19 NOTE — Patient Instructions (Addendum)
You are doing well. No medication changes were made.  Take Nitro for chest pain  For fast heart rate, you could take extra amiodarone  Take lasix as needed for worsening leg swelling  Please call us if you have new issues that need to be addressed before your next appt.  Your physician wants you to follow-up in: 6 months.  You will receive a reminder letter in the mail two months in advance. If you don't receive a letter, please call our office to schedule the follow-up appointment.

## 2012-05-19 NOTE — Assessment & Plan Note (Signed)
Currently with no symptoms of angina. No further workup at this time. Continue current medication regimen. 

## 2012-05-19 NOTE — Progress Notes (Signed)
Patient ID: Grace Bennett, female    DOB: 1918/12/20, 79 y.o.   MRN: [id number]  HPI Comments: Grace Bennett is a very pleasant 79 year old woman, patient of Dr. Darrick Huntsman, with a past medical history of atrial fibrillation, coronary artery disease with angioplasty in 1995, mild carotid arterial disease, thyroid cancer with resection currently on thyroid supplementation, insomnia,   chest pain episodes, chronic general malaise and depression, gait instability who presents for routine followup.  On her last clinic visit, she had had several falls. Since then, she has been doing well with no falls. She does have nursing that comes out to visit her periodically. She does report episode of chest pain last Tuesday. She went to the emergency room. Chest x-ray was normal, cardiac enzymes normal. Basic metabolic panel and LFTs normal. EKG was unchanged. She was discharged home. She did not want to stay in the hospital.  She lives alone, tries to stay independent but is unable to drive secondary to macular degeneration.  She does not do any regular exercise.  She sits in her recliner chair for long periods at a time.   She does not have any energy during the daytime. Her biggest complaint is poor balance  From her last clinic visit , She is not interested in any cardiac catheterization or stress testing at this time. She is asking for Lasix for lower extremity edema. Previously had low blood pressure on Lasix daily  EKG shows heart rate 61 beats per minute, normal sinus rhythm, left bundle branch block    Outpatient Encounter Prescriptions as of 05/19/2012  Medication Sig Dispense Refill  . ALPRAZolam (XANAX) 0.25 MG tablet Take 1 tablet (0.25 mg total) by mouth at bedtime as needed.  30 tablet  1  . amiodarone (PACERONE) 200 MG tablet TAKE ONE (1) TABLET EACH DAY  90 tablet  2  . cyanocobalamin (,VITAMIN B-12,) 1000 MCG/ML injection INJECT IM ONCE A MONTH  12 mL  11  . escitalopram (LEXAPRO) 10 MG tablet  TAKE ONE (1) TABLET EACH DAY  30 tablet  5  . furosemide (LASIX) 20 MG tablet Take 20 mg by mouth daily.      . hydrochlorothiazide (HYDRODIURIL) 25 MG tablet Take once a week on Tuesday.      . lactulose (CHRONULAC) 10 GM/15ML solution Take two tablespoonfuls every day if no bowel movement  240 mL  0  . levothyroxine (SYNTHROID, LEVOTHROID) 137 MCG tablet TAKE ONE (1) TABLET EACH DAY  90 tablet  2  . nitroGLYCERIN (NITROSTAT) 0.4 MG SL tablet Place 1 tablet (0.4 mg total) under the tongue every 5 (five) minutes as needed.  25 tablet  6  . pantoprazole (PROTONIX) 40 MG tablet TAKE ONE (1) TABLET EACH DAY  90 tablet  2  . potassium chloride (K-DUR) 10 MEQ tablet Take 10 mEq by mouth daily.      . temazepam (RESTORIL) 15 MG capsule TAKE ONE CAPSULE AT BEDTIME AS NEEDED   FOR SLEEP  30 capsule  3  . [DISCONTINUED] hydrochlorothiazide (HYDRODIURIL) 25 MG tablet Take 1 tablet (25 mg total) by mouth daily.  90 tablet  1  . [DISCONTINUED] potassium chloride (KLOR-CON 10) 10 MEQ tablet Take 2 tablets (20 mEq total) by mouth daily.  90 tablet  3  . [DISCONTINUED] potassium chloride (MICRO-K) 10 MEQ CR capsule Take 10 mEq by mouth daily.         No facility-administered encounter medications on file as of 05/19/2012.  Review of Systems  Constitutional: Positive for fatigue.  HENT: Negative.   Eyes: Negative.   Respiratory: Negative.   Cardiovascular: Negative.   Gastrointestinal: Negative.   Musculoskeletal: Positive for gait problem.  Skin: Negative.   Neurological: Positive for weakness.  Psychiatric/Behavioral: Negative.   All other systems reviewed and are negative.    BP 146/75  Pulse 61  Ht 5\' 6"  (1.676 m)  Wt 207 lb (93.895 kg)  BMI 33.43 kg/m2  Physical Exam  Nursing note and vitals reviewed. Constitutional: She is oriented to person, place, and time. She appears well-developed and well-nourished.  Elderly woman in no apparent distress, walking with a walker  HENT:  Head:  Normocephalic.  Nose: Nose normal.  Mouth/Throat: Oropharynx is clear and moist.  Eyes: Conjunctivae are normal. Pupils are equal, round, and reactive to light.  Neck: Normal range of motion. Neck supple. No JVD present.  Cardiovascular: Normal rate, regular rhythm, S1 normal, S2 normal, normal heart sounds and intact distal pulses.  Exam reveals no gallop and no friction rub.   No murmur heard. Pulmonary/Chest: Effort normal and breath sounds normal. No respiratory distress. She has no wheezes. She has no rales. She exhibits no tenderness.  Abdominal: Soft. Bowel sounds are normal. She exhibits no distension. There is no tenderness.  Musculoskeletal: Normal range of motion. She exhibits no edema and no tenderness.  Lymphadenopathy:    She has no cervical adenopathy.  Neurological: She is alert and oriented to person, place, and time. Coordination normal.  Skin: Skin is warm and dry. No rash noted. No erythema.  Psychiatric: She has a normal mood and affect. Her behavior is normal. Judgment and thought content normal.    Assessment and Plan

## 2012-06-30 ENCOUNTER — Other Ambulatory Visit: Payer: Self-pay | Admitting: Internal Medicine

## 2012-07-04 ENCOUNTER — Telehealth: Payer: Self-pay | Admitting: *Deleted

## 2012-07-04 MED ORDER — POTASSIUM CHLORIDE ER 10 MEQ PO TBCR: 10.0000 meq | EXTENDED_RELEASE_TABLET | Freq: Every day | ORAL | Status: DC

## 2012-07-04 NOTE — Telephone Encounter (Signed)
Patient would  back in like her potassium in pill form that liquid is not working for her.

## 2012-07-25 ENCOUNTER — Ambulatory Visit (INDEPENDENT_AMBULATORY_CARE_PROVIDER_SITE_OTHER): Payer: Medicare Other | Admitting: Internal Medicine

## 2012-07-25 ENCOUNTER — Encounter: Payer: Self-pay | Admitting: Internal Medicine

## 2012-07-25 VITALS — BP 132/68 | HR 68 | Temp 98.2°F | Resp 14 | Wt 196.2 lb

## 2012-07-25 DIAGNOSIS — R609 Edema, unspecified: Secondary | ICD-10-CM

## 2012-07-25 DIAGNOSIS — R634 Abnormal weight loss: Secondary | ICD-10-CM

## 2012-07-25 DIAGNOSIS — R5381 Other malaise: Secondary | ICD-10-CM

## 2012-07-25 DIAGNOSIS — I2510 Atherosclerotic heart disease of native coronary artery without angina pectoris: Secondary | ICD-10-CM

## 2012-07-25 DIAGNOSIS — I951 Orthostatic hypotension: Secondary | ICD-10-CM

## 2012-07-25 DIAGNOSIS — R5383 Other fatigue: Secondary | ICD-10-CM

## 2012-07-25 DIAGNOSIS — R0601 Orthopnea: Secondary | ICD-10-CM

## 2012-07-25 NOTE — Progress Notes (Addendum)
Patient ID: Grace Bennett, female   DOB: [birth year], 79 y.o.   MRN: [id number]  Patient Active Problem List   Diagnosis Date Noted  . Edema 05/19/2012  . Orthostasis 01/21/2012  . 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 Depressive disorder 02/21/2011  . A-fib   . Hypertension   . Chest pain 09/17/2010  . Malaise 09/17/2010  . CAD (coronary artery disease) 09/17/2010  . HTN (hypertension) 09/17/2010    Subjective:  CC:   Chief Complaint  Patient presents with  . Follow-up    HPI:   Grace Bennett a 79 y.o. female who presents with malaise and generalized weakness.    Past Medical History  Diagnosis Date  . Anemia   . A-fib   . Atrial fibrillation   . Pain in joint, pelvic region and thigh   . Paget's disease     of left breast, nipple removed 2005  . H/O: GI bleed     lower while on coumadin, s/p 6 units PRBCs  . Thyroid disease   . Lower GI bleed     hx of while on coumadine..s/p 6 units PRBC'S  . Hypertension     Past Surgical History  Procedure Laterality Date  . Cholecystectomy  1999  . Left renal fixation surgery   1947  . Thyroidectomy    . Fetal blood transfusion      for diverticulitis & GI bleed       The following portions of the patient's history were reviewed and updated as appropriate: Allergies, current medications, and problem list.    Review of Systems:   12 Pt  review of systems was negative except those addressed in the HPI,     History   Social History  . Marital Status: Widowed    Spouse Name: N/A    Number of Children: N/A  . Years of Education: N/A   Occupational History  . Not on file.   Social History Main Topics  . Smoking status: Never Smoker   . Smokeless tobacco: Never Used  . Alcohol Use: No  . Drug Use: No  . Sexually Active: Not on file   Other Topics Concern  . Not on file   Social History Narrative   Lives alone at retirement community    Objective:  BP 132/68  Pulse 68  Temp(Src) 98.2 F (36.8 C)  (Oral)  Resp 14  Wt 196 lb 4 oz (89.018 kg)  BMI 31.69 kg/m2  SpO2 94%  General appearance: alert, cooperative and appears stated age Neck: no adenopathy, no carotid bruit, supple, symmetrical, trachea midline and thyroid not enlarged, symmetric, no tenderness/mass/nodules Back: symmetric, no curvature. ROM normal. No CVA tenderness. Lungs: clear to auscultation bilaterally Heart: regular rate and rhythm, S1, S2 normal, no murmur, click, rub or gallop Abdomen: soft, non-tender; bowel sounds normal; no masses,  no organomegaly Pulses: 2+ and symmetric Skin: Skin color, texture, turgor normal. No rashes or lesions Lymph nodes: Cervical, supraclavicular, and axillary nodes normal. Neuro:  Difficulty rising from chair, positive Romberg, gait unstable  Assessment and Plan:  Malaise Chronic,  Unrelenting.  I have tried treating her depression.  She needs increased socialization but refuses to consder alternative living situations.   Edema Does not appear to be due to CHF.  Will decrease lasix use to every other day.   Orthostasis With loss of balance and generalized weakness.   Patient requires home physical therapy and nusring for evaluation and treatment of orthopnea and balance disorder and  report weight changes or changes in vital signs that suggest impending decompensation leading to repeat admissions.  Patient is homebound due to inability to ambulate  More than  20 feet without loss of balance and  shortness of breath.          Updated Medication List Outpatient Encounter Prescriptions as of 07/25/2012  Medication Sig Dispense Refill  . ALPRAZolam (XANAX) 0.25 MG tablet Take 1 tablet (0.25 mg total) by mouth at bedtime as needed.  30 tablet  1  . amiodarone (PACERONE) 200 MG tablet TAKE ONE (1) TABLET EACH DAY  90 tablet  2  . cyanocobalamin (,VITAMIN B-12,) 1000 MCG/ML injection INJECT IM ONCE A MONTH  12 mL  11  . escitalopram (LEXAPRO) 10 MG tablet TAKE ONE (1) TABLET  EACH DAY  30 tablet  5  . furosemide (LASIX) 20 MG tablet Take 1 tablet (20 mg total) by mouth daily as needed.  30 tablet  2  . hydrochlorothiazide (HYDRODIURIL) 25 MG tablet Take once a week on Tuesday.      . lactulose (CHRONULAC) 10 GM/15ML solution Take two tablespoonfuls every day if no bowel movement  240 mL  0  . levothyroxine (SYNTHROID, LEVOTHROID) 137 MCG tablet TAKE ONE (1) TABLET EACH DAY  90 tablet  2  . nitroGLYCERIN (NITROSTAT) 0.4 MG SL tablet Place 1 tablet (0.4 mg total) under the tongue every 5 (five) minutes as needed.  25 tablet  6  . pantoprazole (PROTONIX) 40 MG tablet TAKE ONE (1) TABLET EACH DAY  90 tablet  2  . potassium chloride (K-DUR) 10 MEQ tablet Take 1 tablet (10 mEq total) by mouth daily.  30 tablet  3  . temazepam (RESTORIL) 15 MG capsule TAKE ONE CAPSULE AT BEDTIME AS NEEDED   FOR SLEEP  30 capsule  3   No facility-administered encounter medications on file as of 07/25/2012.     Orders Placed This Encounter  Procedures  . TSH  . Magnesium  . B Nat Peptide  . Comprehensive metabolic panel    Return in about 4 weeks (around 08/22/2012).

## 2012-07-25 NOTE — Patient Instructions (Addendum)
We are going to get some help for you at home.   I will check with Dr Mariah Milling about your heart 1

## 2012-07-26 LAB — COMPREHENSIVE METABOLIC PANEL
AST: 20 U/L (ref 0–37)
Alkaline Phosphatase: 88 U/L (ref 39–117)
BUN: 21 mg/dL (ref 6–23)
Calcium: 8.5 mg/dL (ref 8.4–10.5)
Creatinine, Ser: 1.1 mg/dL (ref 0.4–1.2)
Total Bilirubin: 0.5 mg/dL (ref 0.3–1.2)

## 2012-07-26 LAB — MAGNESIUM: Magnesium: 2.2 mg/dL (ref 1.5–2.5)

## 2012-07-26 LAB — BRAIN NATRIURETIC PEPTIDE: Pro B Natriuretic peptide (BNP): 332 pg/mL — ABNORMAL HIGH (ref 0.0–100.0)

## 2012-07-26 LAB — TSH: TSH: 3.03 u[IU]/mL (ref 0.35–5.50)

## 2012-07-27 ENCOUNTER — Encounter: Payer: Self-pay | Admitting: Internal Medicine

## 2012-07-27 NOTE — Assessment & Plan Note (Signed)
Chronic,  Unrelenting.  I have tried treating her depression.  She needs increased socialization but refuses to consder alternative living situations.

## 2012-07-27 NOTE — Assessment & Plan Note (Signed)
Does not appear to be due to CHF.  Will decrease lasix use to every other day.

## 2012-07-27 NOTE — Assessment & Plan Note (Signed)
With loss of balance and generalized weakness.   Patient requires home physical therapy and nusring for evaluation and treatment of orthopnea and balance disorder and report weight changes or changes in vital signs that suggest impending decompensation leading to repeat admissions.  Patient is homebound due to inability to ambulate  More than  20 feet without loss of balance and  shortness of breath.

## 2012-08-04 NOTE — Telephone Encounter (Signed)
Called and talked with patient niece scheduled appointment for patient and advised niece if shortness of breath became any worse to call office or go to ER.

## 2012-08-15 ENCOUNTER — Telehealth: Payer: Self-pay | Admitting: Internal Medicine

## 2012-08-15 ENCOUNTER — Ambulatory Visit (INDEPENDENT_AMBULATORY_CARE_PROVIDER_SITE_OTHER): Payer: Medicare Other | Admitting: Internal Medicine

## 2012-08-15 VITALS — BP 118/60 | HR 79 | Temp 98.0°F | Resp 12 | Wt 204.5 lb

## 2012-08-15 DIAGNOSIS — J209 Acute bronchitis, unspecified: Secondary | ICD-10-CM

## 2012-08-15 DIAGNOSIS — R609 Edema, unspecified: Secondary | ICD-10-CM

## 2012-08-15 MED ORDER — METHYLPREDNISOLONE ACETATE 40 MG/ML IJ SUSP
40.0000 mg | Freq: Once | INTRAMUSCULAR | Status: AC
  Administered 2012-08-15: 40 mg via INTRAMUSCULAR

## 2012-08-15 MED ORDER — ALBUTEROL SULFATE 1.25 MG/3ML IN NEBU: 1.0000 | INHALATION_SOLUTION | Freq: Four times a day (QID) | RESPIRATORY_TRACT | Status: DC | PRN

## 2012-08-15 MED ORDER — AMOXICILLIN 500 MG PO CAPS: 500.0000 mg | ORAL_CAPSULE | Freq: Three times a day (TID) | ORAL | Status: DC

## 2012-08-15 MED ORDER — ALBUTEROL SULFATE (2.5 MG/3ML) 0.083% IN NEBU
2.5000 mg | INHALATION_SOLUTION | Freq: Once | RESPIRATORY_TRACT | Status: AC
  Administered 2012-08-15: 2.5 mg via RESPIRATORY_TRACT

## 2012-08-15 NOTE — Progress Notes (Signed)
Patient ID: Grace Bennett, female   DOB: [birth year], 79 y.o.   MRN: [id number]  Patient Active Problem List   Diagnosis Date Noted  . Acute bronchitis 08/16/2012  . Edema 05/19/2012  . Orthostasis 01/21/2012  . 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 Depressive disorder 02/21/2011  . A-fib   . Hypertension   . Chest pain 09/17/2010  . Malaise 09/17/2010  . CAD (coronary artery disease) 09/17/2010  . HTN (hypertension) 09/17/2010    Subjective:  CC:   Chief Complaint  Patient presents with  . Acute Visit    coughing, SOB, sweating,X 4 days went to urgent care on Sunday and prescribed Doxycycline, flonase    HPI:   Grace Rubendall Somersis a 79 y.o. female who presents  Past Medical History  Diagnosis Date  . Anemia   . A-fib   . Atrial fibrillation   . Pain in joint, pelvic region and thigh   . Paget's disease     of left breast, nipple removed 2005  . H/O: GI bleed     lower while on coumadin, s/p 6 units PRBCs  . Thyroid disease   . Lower GI bleed     hx of while on coumadine..s/p 6 units PRBC'S  . Hypertension     Past Surgical History  Procedure Laterality Date  . Cholecystectomy  1999  . Left renal fixation surgery   1947  . Thyroidectomy    . Fetal blood transfusion      for diverticulitis & GI bleed       The following portions of the patient's history were reviewed and updated as appropriate: Allergies, current medications, and problem list.    Review of Systems:   12 Pt  review of systems was negative except those addressed in the HPI,     History   Social History  . Marital Status: Widowed    Spouse Name: N/A    Number of Children: N/A  . Years of Education: N/A   Occupational History  . Not on file.   Social History Main Topics  . Smoking status: Never Smoker   . Smokeless tobacco: Never Used  . Alcohol Use: No  . Drug Use: No  . Sexually Active: Not on file   Other Topics Concern  . Not on file   Social History Narrative   Lives alone at  retirement community    Objective:  BP 118/60  Pulse 79  Temp(Src) 98 F (36.7 C) (Oral)  Resp 12  Wt 204 lb 8 oz (92.761 kg)  BMI 33.02 kg/m2  SpO2 94%  General appearance: alert, cooperative and appears stated age Ears: normal TM's and external ear canals both ears Throat: lips, mucosa, and tongue normal; teeth and gums normal Neck: no adenopathy, no carotid bruit, supple, symmetrical, trachea midline and thyroid not enlarged, symmetric, no tenderness/mass/nodules Back: symmetric, no curvature. ROM normal. No CVA tenderness. Lungs: clear to auscultation bilaterally Heart: regular rate and rhythm, S1, S2 normal, no murmur, click, rub or gallop Abdomen: soft, non-tender; bowel sounds normal; no masses,  no organomegaly Pulses: 2+ and symmetric Skin: Skin color, texture, turgor normal. No rashes or lesions Lymph nodes: Cervical, supraclavicular, and axillary nodes normal.  Assessment and Plan:  Acute bronchitis She has a harsh cough which is followed by wheezing with forced exhalation on exam but not at rest, and is not hypoxic and does not appear to be in fluid overload.  She does not meet criteria for admission for  These reasons,  but is at high risk because of age and independent living status.  I have given her an albuteterol nebulizer treatment in office which she tolerated and noted improvement in chest tightness.,  IM Dep Medrol also given.  changing abx from doxycycline, which is causing nuasea, to amoxicillin (avoiding azithromycin and levaquin bc of atrial fib), prescribing home albuterol nebs and [facility] to follow up with her tomorrow.  Chest x ray ordered.    Edema Stable since reduction in lasix by Dr . Mariah Milling to every other day..   A total of 40 minutes was spent with patient more than half of which was spent in counseling, reviewing records from other providers and coordination of care.  Updated Medication List Outpatient Encounter Prescriptions as of 08/15/2012   Medication Sig Dispense Refill  . ALPRAZolam (XANAX) 0.25 MG tablet Take 1 tablet (0.25 mg total) by mouth at bedtime as needed.  30 tablet  1  . amiodarone (PACERONE) 200 MG tablet TAKE ONE (1) TABLET EACH DAY  90 tablet  2  . cyanocobalamin (,VITAMIN B-12,) 1000 MCG/ML injection INJECT IM ONCE A MONTH  12 mL  11  . escitalopram (LEXAPRO) 10 MG tablet TAKE ONE (1) TABLET EACH DAY  30 tablet  5  . furosemide (LASIX) 20 MG tablet Take 1 tablet (20 mg total) by mouth daily as needed.  30 tablet  2  . lactulose (CHRONULAC) 10 GM/15ML solution Take two tablespoonfuls every day if no bowel movement  240 mL  0  . levothyroxine (SYNTHROID, LEVOTHROID) 137 MCG tablet TAKE ONE (1) TABLET EACH DAY  90 tablet  2  . nitroGLYCERIN (NITROSTAT) 0.4 MG SL tablet Place 1 tablet (0.4 mg total) under the tongue every 5 (five) minutes as needed.  25 tablet  6  . pantoprazole (PROTONIX) 40 MG tablet TAKE ONE (1) TABLET EACH DAY  90 tablet  2  . potassium chloride (K-DUR) 10 MEQ tablet Take 1 tablet (10 mEq total) by mouth daily.  30 tablet  3  . temazepam (RESTORIL) 15 MG capsule TAKE ONE CAPSULE AT BEDTIME AS NEEDED   FOR SLEEP  30 capsule  3  . albuterol (ACCUNEB) 1.25 MG/3ML nebulizer solution Take 3 mLs (1.25 mg total) by nebulization every 6 (six) hours as needed for wheezing.  75 mL  12  . amoxicillin (AMOXIL) 500 MG capsule Take 1 capsule (500 mg total) by mouth 3 (three) times daily.  21 capsule  0  . hydrochlorothiazide (HYDRODIURIL) 25 MG tablet Take once a week on Tuesday.      . [DISCONTINUED] albuterol (ACCUNEB) 1.25 MG/3ML nebulizer solution Take 3 mLs (1.25 mg total) by nebulization every 6 (six) hours as needed for wheezing.  75 mL  12  . [DISCONTINUED] amoxicillin (AMOXIL) 500 MG capsule Take 1 capsule (500 mg total) by mouth 3 (three) times daily.  21 capsule  0  . [EXPIRED] albuterol (PROVENTIL) (2.5 MG/3ML) 0.083% nebulizer solution 2.5 mg       . [EXPIRED] methylPREDNISolone acetate  (DEPO-MEDROL) injection 40 mg        No facility-administered encounter medications on file as of 08/15/2012.     Orders Placed This Encounter  Procedures  . DME Nebulizer machine  . DG Chest 2 View    No Follow-up on file.

## 2012-08-15 NOTE — Telephone Encounter (Signed)
Please make sure they can go out tomorrow to check on patientand make sure she is able to  Use the nebulizer .  If they cannot, let me know

## 2012-08-15 NOTE — Telephone Encounter (Signed)
Patient needs to be seen today for shortness of breath.  Taken to Urgent Care yesterday by her niece Nathaneil Canary and treated with antibiotics and flonase.  I will work her in this afternoon [phone] is Daryl's  work number;  she works at [employer]

## 2012-08-15 NOTE — Telephone Encounter (Signed)
Grace Bennett with [facility] called and stated the patient is audibly wheezing. The patient is really SOB and she is lead to believe the patient is dehydrated. She states she spoke with the patient around 2:30 p.m. April, RN with[facility] is quite concerned about patients health. I informed her the patient is being seen around 4:30 or sooner if she could get here. Grace Bennett stated that she has been accepted to [facility]. This is just a FYI.

## 2012-08-15 NOTE — Telephone Encounter (Signed)
Called and added patient to schedule  Grace Bennett coming with patient added to end of day but advised patient if she could be here sooner we would work patient in.

## 2012-08-15 NOTE — Patient Instructions (Addendum)
I sent an order for the nebulizer and the medication that goes in it to your pharmacy.   [facility] will come out tomorrow to make sure you are comfortable using it   I am changing your antibioitic to amoxicillin,  Every 8 hours for one week   You can get your chest x ray tomorrow

## 2012-08-16 DIAGNOSIS — J209 Acute bronchitis, unspecified: Secondary | ICD-10-CM | POA: Insufficient documentation

## 2012-08-16 NOTE — Telephone Encounter (Signed)
Patient stated she was able to use neb treatment, but slept in chair last night because of congestion. Notified MS. Walker to call if any concerns

## 2012-08-16 NOTE — Assessment & Plan Note (Signed)
Stable since reduction in lasix by Dr . Mariah Milling to every other day.Grace Bennett

## 2012-08-16 NOTE — Assessment & Plan Note (Signed)
She has a harsh cough which is followed by wheezing with forced exhalation on exam but not at rest, and is not hypoxic and does not appear to be in fluid overload.  She does not meet criteria for admission for  These reasons, but is at high risk because of age and independent living status.  I have given her an albuteterol nebulizer treatment in office which she tolerated and noted improvement in chest tightness.,  IM Dep Medrol also given.  changing abx from doxycycline, which is causing nuasea, to amoxicillin (avoiding azithromycin and levaquin bc of atrial fib), prescribing home albuterol nebs and [facility] to follow up with her tomorrow.  Chest x ray ordered.

## 2012-08-17 ENCOUNTER — Ambulatory Visit: Payer: Self-pay | Admitting: Internal Medicine

## 2012-08-17 ENCOUNTER — Telehealth: Payer: Self-pay | Admitting: *Deleted

## 2012-08-17 NOTE — Telephone Encounter (Signed)
Grace Bennett called and stated patient X-rays completed today, stated patient sounds better but still complaining of extreme weakness and fatigue. Instructed to call tomorrow if symptoms have not improved. Mrs. Grace Bennett called so you would know the X-ray has been completed.

## 2012-08-17 NOTE — Telephone Encounter (Signed)
The chest x ray report did not suggest congestive heart failure or pneumonia, but the lower lobes were not completely expanded because she didn't take a deep enough breath, so there could be a pneumonia there.  As long as she is not having fevers, I would continue the antibiotics and the nebulizer as needed if she finds it helpful.  I would like to know how often [facility] checks up on her this week (from [place]) either in person or by phone

## 2012-08-18 NOTE — Telephone Encounter (Signed)
FYI : Daryl notified of results and patient stated she is feeling better as of late yesterday. Nelma Rothman will notify how often [facility] monitors.

## 2012-08-19 ENCOUNTER — Inpatient Hospital Stay: Payer: Self-pay | Admitting: Internal Medicine

## 2012-08-19 LAB — PRO B NATRIURETIC PEPTIDE: B-Type Natriuretic Peptide: 8064 pg/mL — ABNORMAL HIGH (ref 0–450)

## 2012-08-19 LAB — COMPREHENSIVE METABOLIC PANEL
Albumin: 2.9 g/dL — ABNORMAL LOW (ref 3.4–5.0)
Alkaline Phosphatase: 106 U/L (ref 50–136)
Anion Gap: 6 — ABNORMAL LOW (ref 7–16)
Bilirubin,Total: 0.4 mg/dL (ref 0.2–1.0)
Chloride: 110 mmol/L — ABNORMAL HIGH (ref 98–107)
Creatinine: 1.07 mg/dL (ref 0.60–1.30)
EGFR (Non-African Amer.): 45 — ABNORMAL LOW
Glucose: 118 mg/dL — ABNORMAL HIGH (ref 65–99)
Osmolality: 290 (ref 275–301)
Potassium: 4.4 mmol/L (ref 3.5–5.1)

## 2012-08-19 LAB — CBC WITH DIFFERENTIAL/PLATELET
Basophil #: 0.1 10*3/uL (ref 0.0–0.1)
Eosinophil #: 0.1 10*3/uL (ref 0.0–0.7)
Eosinophil %: 0.6 %
Lymphocyte #: 1.8 10*3/uL (ref 1.0–3.6)
Lymphocyte %: 14.8 %
MCH: 28.4 pg (ref 26.0–34.0)
MCHC: 32.7 g/dL (ref 32.0–36.0)
MCV: 87 fL (ref 80–100)
Monocyte #: 1 x10 3/mm — ABNORMAL HIGH (ref 0.2–0.9)
Monocyte %: 8.5 %

## 2012-08-19 LAB — TROPONIN I: Troponin-I: <0.02 ng/mL

## 2012-08-19 LAB — CK TOTAL AND CKMB (NOT AT ARMC)
CK, Total: 35 U/L (ref 21–215)
CK-MB: 1 ng/mL (ref 0.5–3.6)

## 2012-08-20 ENCOUNTER — Ambulatory Visit: Payer: Self-pay | Admitting: Nurse Practitioner

## 2012-08-20 LAB — CK TOTAL AND CKMB (NOT AT ARMC)
CK, Total: 25 U/L (ref 21–215)
CK-MB: 0.8 ng/mL (ref 0.5–3.6)

## 2012-08-20 LAB — BASIC METABOLIC PANEL
Anion Gap: 5 — ABNORMAL LOW (ref 7–16)
BUN: 17 mg/dL (ref 7–18)
Calcium, Total: 8.1 mg/dL — ABNORMAL LOW (ref 8.5–10.1)
Creatinine: 1.1 mg/dL (ref 0.60–1.30)
EGFR (African American): 50 — ABNORMAL LOW
Glucose: 86 mg/dL (ref 65–99)
Osmolality: 290 (ref 275–301)
Potassium: 3.7 mmol/L (ref 3.5–5.1)

## 2012-08-21 ENCOUNTER — Encounter: Payer: Self-pay | Admitting: Internal Medicine

## 2012-08-23 ENCOUNTER — Ambulatory Visit: Payer: Medicare Other | Admitting: Internal Medicine

## 2012-08-24 LAB — BASIC METABOLIC PANEL
Anion Gap: 5 — ABNORMAL LOW (ref 7–16)
BUN: 22 mg/dL — ABNORMAL HIGH (ref 7–18)
Calcium, Total: 8 mg/dL — ABNORMAL LOW (ref 8.5–10.1)
Chloride: 100 mmol/L (ref 98–107)
Co2: 35 mmol/L — ABNORMAL HIGH (ref 21–32)
EGFR (Non-African Amer.): 37 — ABNORMAL LOW
Glucose: 88 mg/dL (ref 65–99)
Potassium: 3.5 mmol/L (ref 3.5–5.1)

## 2012-08-24 LAB — TSH: Thyroid Stimulating Horm: 2.93 u[IU]/mL

## 2012-08-25 LAB — CULTURE, BLOOD (SINGLE)

## 2012-08-28 ENCOUNTER — Ambulatory Visit: Payer: Self-pay | Admitting: Gerontology

## 2012-08-28 LAB — COMPREHENSIVE METABOLIC PANEL
Albumin: 2.9 g/dL — ABNORMAL LOW (ref 3.4–5.0)
Alkaline Phosphatase: 85 U/L (ref 50–136)
BUN: 21 mg/dL — ABNORMAL HIGH (ref 7–18)
Bilirubin,Total: 0.5 mg/dL (ref 0.2–1.0)
Co2: 30 mmol/L (ref 21–32)
EGFR (African American): 42 — ABNORMAL LOW
EGFR (Non-African Amer.): 36 — ABNORMAL LOW
Glucose: 43 mg/dL — ABNORMAL LOW (ref 65–99)
Osmolality: 279 (ref 275–301)
Potassium: 3.7 mmol/L (ref 3.5–5.1)
Sodium: 140 mmol/L (ref 136–145)

## 2012-08-28 LAB — CBC WITH DIFFERENTIAL/PLATELET
Basophil #: 0.1 10*3/uL (ref 0.0–0.1)
Basophil %: 0.6 %
Eosinophil #: 0.2 10*3/uL (ref 0.0–0.7)
HCT: 38.3 % (ref 35.0–47.0)
Lymphocyte %: 15 %
MCH: 28.3 pg (ref 26.0–34.0)
MCHC: 31.9 g/dL — ABNORMAL LOW (ref 32.0–36.0)
MCV: 89 fL (ref 80–100)
Monocyte #: 1.2 x10 3/mm — ABNORMAL HIGH (ref 0.2–0.9)
Monocyte %: 9.7 %
Neutrophil #: 9.2 10*3/uL — ABNORMAL HIGH (ref 1.4–6.5)
Neutrophil %: 73.5 %
WBC: 12.6 10*3/uL — ABNORMAL HIGH (ref 3.6–11.0)

## 2012-09-04 LAB — BASIC METABOLIC PANEL
BUN: 17 mg/dL (ref 7–18)
Calcium, Total: 7.8 mg/dL — ABNORMAL LOW (ref 8.5–10.1)
Chloride: 105 mmol/L (ref 98–107)
Creatinine: 1.29 mg/dL (ref 0.60–1.30)
EGFR (African American): 42 — ABNORMAL LOW
EGFR (Non-African Amer.): 36 — ABNORMAL LOW
Osmolality: 281 (ref 275–301)
Potassium: 3.9 mmol/L (ref 3.5–5.1)
Sodium: 140 mmol/L (ref 136–145)

## 2012-09-04 LAB — CBC WITH DIFFERENTIAL/PLATELET
Basophil %: 0.5 %
Eosinophil #: 0.2 10*3/uL (ref 0.0–0.7)
HCT: 36.5 % (ref 35.0–47.0)
Lymphocyte #: 1.7 10*3/uL (ref 1.0–3.6)
Lymphocyte %: 13.7 %
MCH: 28.7 pg (ref 26.0–34.0)
Monocyte #: 1.4 x10 3/mm — ABNORMAL HIGH (ref 0.2–0.9)
Monocyte %: 11.5 %
Neutrophil #: 8.9 10*3/uL — ABNORMAL HIGH (ref 1.4–6.5)
Neutrophil %: 73 %
Platelet: 213 10*3/uL (ref 150–440)
RDW: 15.9 % — ABNORMAL HIGH (ref 11.5–14.5)
WBC: 12.2 10*3/uL — ABNORMAL HIGH (ref 3.6–11.0)

## 2012-09-05 ENCOUNTER — Ambulatory Visit: Payer: Medicare Other | Admitting: Internal Medicine

## 2012-09-05 ENCOUNTER — Ambulatory Visit: Payer: Self-pay | Admitting: Internal Medicine

## 2012-09-05 ENCOUNTER — Ambulatory Visit: Payer: Self-pay | Admitting: Gerontology

## 2012-09-05 DIAGNOSIS — I059 Rheumatic mitral valve disease, unspecified: Secondary | ICD-10-CM

## 2012-09-19 ENCOUNTER — Ambulatory Visit: Payer: Self-pay | Admitting: Nurse Practitioner

## 2012-09-19 ENCOUNTER — Encounter: Payer: Self-pay | Admitting: Internal Medicine

## 2012-09-21 ENCOUNTER — Ambulatory Visit (INDEPENDENT_AMBULATORY_CARE_PROVIDER_SITE_OTHER): Payer: Medicare Other | Admitting: Cardiovascular Disease

## 2012-09-21 ENCOUNTER — Encounter: Payer: Self-pay | Admitting: Cardiovascular Disease

## 2012-09-21 ENCOUNTER — Ambulatory Visit: Payer: Medicare Other | Admitting: Cardiovascular Disease

## 2012-09-21 VITALS — BP 111/61 | HR 76 | Ht 65.5 in | Wt 189.0 lb

## 2012-09-21 DIAGNOSIS — I2510 Atherosclerotic heart disease of native coronary artery without angina pectoris: Secondary | ICD-10-CM

## 2012-09-21 DIAGNOSIS — F329 Major depressive disorder, single episode, unspecified: Secondary | ICD-10-CM

## 2012-09-21 DIAGNOSIS — I509 Heart failure, unspecified: Secondary | ICD-10-CM

## 2012-09-21 DIAGNOSIS — I5022 Chronic systolic (congestive) heart failure: Secondary | ICD-10-CM

## 2012-09-21 DIAGNOSIS — I10 Essential (primary) hypertension: Secondary | ICD-10-CM

## 2012-09-21 DIAGNOSIS — R609 Edema, unspecified: Secondary | ICD-10-CM

## 2012-09-21 DIAGNOSIS — I4891 Unspecified atrial fibrillation: Secondary | ICD-10-CM

## 2012-09-21 DIAGNOSIS — J209 Acute bronchitis, unspecified: Secondary | ICD-10-CM

## 2012-09-21 NOTE — Assessment & Plan Note (Signed)
Blood pressure is well controlled on today's visit. No changes made to the medications. 

## 2012-09-21 NOTE — Assessment & Plan Note (Signed)
I suspect her depression is limiting her recovery. Currently not walking well

## 2012-09-21 NOTE — Assessment & Plan Note (Signed)
Appears to be doing better on Lasix 40 mg twice a day. Recent blood work 2 weeks ago show stable renal function. Would suggest repeat basic metabolic panel in 2 weeks. She does not want any additional medications. We'll continue on her current regimen.

## 2012-09-21 NOTE — Assessment & Plan Note (Signed)
Minimal edema on today's exam. 

## 2012-09-21 NOTE — Patient Instructions (Addendum)
You are doing well. No medication changes were made.  Please call us if you have new issues that need to be addressed before your next appt.  Your physician wants you to follow-up in: 2 months.     

## 2012-09-21 NOTE — Assessment & Plan Note (Signed)
Rate is adequately controlled. New atrial fibrillation since February 2014. This could be contributing to her worsening systolic function and heart failure. Appears to be doing well on Lasix 40 mg twice a day. Long discussion about problems with anticoagulation and her history of GI bleeding. She's not interested in going back on anticoagulation. Because of this, we will not be able to get her back to normal sinus rhythm without putting her at high stroke risk. She understands the risk of stroke by not being on warfarin.

## 2012-09-21 NOTE — Assessment & Plan Note (Signed)
Likely has underlying CAD. Now with worsening ejection fraction, 20-25%. She has refused cardiac catheterization in the past. Ulcer a few stress testing. We'll continue medical management.

## 2012-09-21 NOTE — Assessment & Plan Note (Addendum)
She does have a deep cough. Lungs are relatively clear though this is concerning for brewing bronchitis. I suggested to the patient and her daughter that if symptoms get worse that they talk to the covering physician at [facility] for a antibiotic.

## 2012-09-21 NOTE — Progress Notes (Signed)
Patient ID: Grace Bennett, female    DOB: Aug 06, 1918, 79 y.o.   MRN: [id number]  HPI Comments: Grace Bennett is a very pleasant 79 year old woman, patient of Dr. Darrick Huntsman, with history of atrial fibrillation, coronary artery disease with angioplasty in 1995, mild carotid arterial disease, thyroid cancer with resection currently on thyroid supplementation, insomnia,   chest pain episodes, chronic general malaise and depression, gait instability who presents for routine followup. She has refused cardiac workup in the past. History  of falls, has had weak legs from any years. She was in normal sinus rhythm in February 2014 converted to atrial fibrillation likely in the setting of GI issues or bronchitis,   Recent hospital admission to [facility]. She had a "stomach virus", then had bronchitis and developed "CHF". Notes indicate admission on 08/19/2012 with discharge 08/21/2012. Acute on chronic diastolic CHF. Echocardiogram in 2012 showed ejection fraction 50-55%. Echocardiogram showed ejection fraction 20-25% on 09/05/2012. Moderate LVH, moderately dilated left atrium, moderate MR, right ventricular systolic pressures estimated at 40 mmHg   she is staying at [facility] in rehabilitation. Initially was discharged home on Lasix 20 mg every other day. This was increased to 40 mg twice a day several weeks ago.. She feels tired, unable to walk for a car. She is frustrated that her legs are getting better. She just does not have any energy. Uncertain where she is going to go next, skilled nursing facility or home with help .  She has a chronic hoarse cough, secretions in her upper airway .  She reports history of GI bleed several years ago. She was told she had diverticuli .  EKG shows  atrial fibrillation,  left bundle branch block , heart rate 76 beats per minute,    Outpatient Encounter Prescriptions as of 09/21/2012  Medication Sig Dispense Refill  . acetaminophen (TYLENOL) 325 MG tablet Take 650 mg by mouth every  6 (six) hours as needed for pain.      . AZELASTINE-FLUTICASONE NA Place 1 spray into the nose 2 (two) times daily as needed.      Marland Kitchen escitalopram (LEXAPRO) 10 MG tablet TAKE ONE (1) TABLET EACH DAY  30 tablet  5  . furosemide (LASIX) 20 MG tablet Take 20 mg by mouth every other day.      . furosemide (LASIX) 40 MG tablet Take 40 mg by mouth 2 (two) times daily.      . Ipratropium-Albuterol (COMBIVENT IN) Inhale 1 puff into the lungs 4 (four) times daily.      Marland Kitchen levothyroxine (SYNTHROID, LEVOTHROID) 137 MCG tablet TAKE ONE (1) TABLET EACH DAY  90 tablet  2  . Magnesium Hydroxide (MILK OF MAGNESIA PO) Take 30 mLs by mouth as needed.      . metoprolol succinate (TOPROL-XL) 25 MG 24 hr tablet Take 25 mg by mouth daily.      . nitroGLYCERIN (NITROSTAT) 0.4 MG SL tablet Place 1 tablet (0.4 mg total) under the tongue every 5 (five) minutes as needed.  25 tablet  6  . pantoprazole (PROTONIX) 40 MG tablet TAKE ONE (1) TABLET EACH DAY  90 tablet  2  . temazepam (RESTORIL) 7.5 MG capsule Take 7.5 mg by mouth at bedtime as needed for sleep.      . traMADol (ULTRAM) 50 MG tablet Take 50 mg by mouth every 6 (six) hours as needed for pain.      . [DISCONTINUED] furosemide (LASIX) 20 MG tablet Take 1 tablet (20 mg total) by mouth daily  as needed.  30 tablet  2  . [DISCONTINUED] albuterol (ACCUNEB) 1.25 MG/3ML nebulizer solution Take 3 mLs (1.25 mg total) by nebulization every 6 (six) hours as needed for wheezing.  75 mL  12  . [DISCONTINUED] ALPRAZolam (XANAX) 0.25 MG tablet Take 1 tablet (0.25 mg total) by mouth at bedtime as needed.  30 tablet  1  . [DISCONTINUED] amiodarone (PACERONE) 200 MG tablet TAKE ONE (1) TABLET EACH DAY  90 tablet  2  . [DISCONTINUED] amoxicillin (AMOXIL) 500 MG capsule Take 1 capsule (500 mg total) by mouth 3 (three) times daily.  21 capsule  0  . [DISCONTINUED] cyanocobalamin (,VITAMIN B-12,) 1000 MCG/ML injection INJECT IM ONCE A MONTH  12 mL  11  . [DISCONTINUED]  hydrochlorothiazide (HYDRODIURIL) 25 MG tablet Take once a week on Tuesday.      . [DISCONTINUED] lactulose (CHRONULAC) 10 GM/15ML solution Take two tablespoonfuls every day if no bowel movement  240 mL  0  . [DISCONTINUED] potassium chloride (K-DUR) 10 MEQ tablet Take 1 tablet (10 mEq total) by mouth daily.  30 tablet  3  . [DISCONTINUED] temazepam (RESTORIL) 15 MG capsule TAKE ONE CAPSULE AT BEDTIME AS NEEDED   FOR SLEEP  30 capsule  3   No facility-administered encounter medications on file as of 09/21/2012.    Review of Systems  Constitutional: Positive for fatigue.  HENT: Negative.   Eyes: Negative.   Respiratory: Positive for cough and shortness of breath.   Cardiovascular: Negative.   Gastrointestinal: Negative.   Musculoskeletal: Positive for gait problem.  Skin: Negative.   Neurological: Positive for weakness.  Psychiatric/Behavioral: Negative.   All other systems reviewed and are negative.    BP 111/61  Pulse 76  Ht 5' 5.5" (1.664 m)  Wt 189 lb (85.73 kg)  BMI 30.96 kg/m2  Physical Exam  Nursing note and vitals reviewed. Constitutional: She is oriented to person, place, and time. She appears well-developed and well-nourished.  Elderly woman in no apparent distress, sitting in a wheelchair  HENT:  Head: Normocephalic.  Nose: Nose normal.  Mouth/Throat: Oropharynx is clear and moist.  Eyes: Conjunctivae are normal. Pupils are equal, round, and reactive to light.  Neck: Normal range of motion. Neck supple. No JVD present.  Cardiovascular: Normal rate, regular rhythm, S1 normal, S2 normal, normal heart sounds and intact distal pulses.  Exam reveals no gallop and no friction rub.   No murmur heard. Pulmonary/Chest: Effort normal and breath sounds normal. No respiratory distress. She has no wheezes. She has no rales. She exhibits no tenderness.  Abdominal: Soft. Bowel sounds are normal. She exhibits no distension. There is no tenderness.  Musculoskeletal: Normal range of  motion. She exhibits no edema and no tenderness.  Lymphadenopathy:    She has no cervical adenopathy.  Neurological: She is alert and oriented to person, place, and time. Coordination normal.  Skin: Skin is warm and dry. No rash noted. No erythema.  Psychiatric: She has a normal mood and affect. Her behavior is normal. Judgment and thought content normal.    Assessment and Plan

## 2012-10-02 ENCOUNTER — Ambulatory Visit: Payer: Medicare Other | Admitting: Internal Medicine

## 2012-10-02 ENCOUNTER — Encounter: Payer: Self-pay | Admitting: *Deleted

## 2012-10-03 ENCOUNTER — Other Ambulatory Visit: Payer: Self-pay | Admitting: Internal Medicine

## 2012-10-03 ENCOUNTER — Telehealth: Payer: Self-pay | Admitting: *Deleted

## 2012-10-03 ENCOUNTER — Telehealth: Payer: Self-pay | Admitting: Internal Medicine

## 2012-10-03 MED ORDER — ZOLPIDEM TARTRATE 5 MG PO TABS: 5.0000 mg | ORAL_TABLET | Freq: Every evening | ORAL | Status: AC | PRN

## 2012-10-03 MED ORDER — POTASSIUM CHLORIDE ER 20 MEQ PO TBCR: 1.0000 | EXTENDED_RELEASE_TABLET | Freq: Every day | ORAL | Status: AC

## 2012-10-03 NOTE — Telephone Encounter (Signed)
[facility] called for clarification on medication for patient whom is being transferred to [facility]. To fax over meds for clarification received and will return with new instuctions.

## 2012-10-03 NOTE — Telephone Encounter (Signed)
Pharmacy calling to ensure you have the correct fax#:  [phone].  States they are needing Rx for Grace Bennett they discussed with you earlier today.

## 2012-10-03 NOTE — Telephone Encounter (Signed)
Script faxed.

## 2012-10-04 ENCOUNTER — Encounter: Payer: Self-pay | Admitting: Internal Medicine

## 2012-10-06 ENCOUNTER — Encounter: Payer: Self-pay | Admitting: *Deleted

## 2012-10-12 ENCOUNTER — Telehealth: Payer: Self-pay | Admitting: *Deleted

## 2012-10-12 NOTE — Telephone Encounter (Signed)
If this was faxed over yesterday it would have been in red folder please advise

## 2012-10-12 NOTE — Telephone Encounter (Signed)
Marnie from Hospice left a message yesterday stating the patient has orthostatic hypertension, would like to know if you could stop the Metoprolol or add extra Lasix? Think it would be more beneficial to add the Lasix since she is bottoming out in the middle of the night. Also she stated she faxed over some paperwork last and would like to know if that has been completed and faxed back?

## 2012-10-12 NOTE — Telephone Encounter (Signed)
Grace Bennett with hospice called asking about hospice orders and fax regarding aspirin and senna orders. Forms were completed. Faxed hospice orders to Hospice and med orders to [facility].

## 2012-10-18 ENCOUNTER — Ambulatory Visit: Payer: Medicare Other | Admitting: Internal Medicine

## 2012-10-20 ENCOUNTER — Other Ambulatory Visit: Payer: Self-pay | Admitting: Internal Medicine

## 2012-10-20 MED ORDER — TRAMADOL HCL 50 MG PO TABS: 50.0000 mg | ORAL_TABLET | Freq: Two times a day (BID) | ORAL | Status: DC | PRN

## 2012-10-25 ENCOUNTER — Other Ambulatory Visit: Payer: Self-pay

## 2012-10-25 ENCOUNTER — Ambulatory Visit (INDEPENDENT_AMBULATORY_CARE_PROVIDER_SITE_OTHER): Payer: Medicare Other | Admitting: Internal Medicine

## 2012-10-25 VITALS — BP 122/72 | HR 71 | Temp 98.1°F | Resp 12 | Wt 191.2 lb

## 2012-10-25 DIAGNOSIS — I509 Heart failure, unspecified: Secondary | ICD-10-CM

## 2012-10-25 DIAGNOSIS — R5381 Other malaise: Secondary | ICD-10-CM

## 2012-10-25 DIAGNOSIS — I5022 Chronic systolic (congestive) heart failure: Secondary | ICD-10-CM

## 2012-10-26 ENCOUNTER — Telehealth: Payer: Self-pay | Admitting: *Deleted

## 2012-10-26 NOTE — Progress Notes (Signed)
Patient ID: Grace Bennett, female   DOB: 16-Mar-1919, 79 y.o.   MRN: [id number]  Patient Active Problem List   Diagnosis Date Noted  . Chronic systolic CHF (congestive heart failure) 09/21/2012  . Acute bronchitis 08/16/2012  . Edema 05/19/2012  . Orthostasis 01/21/2012  . 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 Depressive disorder 02/21/2011  . A-fib   . Chest pain 09/17/2010  . Malaise 09/17/2010  . CAD (coronary artery disease) 09/17/2010  . HTN (hypertension) 09/17/2010    Subjective:  CC:   Chief Complaint  Patient presents with  . Follow-up    C/O fatigue and tired.    HPI:   Grace Bennett a 79 y.o. female who presents for followup on chronic conditions. Since patient's last visit she has had 2 hospitalizations for respiratory failure now attributed to systolic dysfunction  Her echocardiogram was repeated in 2014, June with major changes compared to her 2012 echo. Previously she was noted to have diastolic dysfunction only secondary to chronic atrial fib she was now noted to have severe cardiomyopathy with EF 20-25% operative a left ventricular hypertrophy concentric, mild aortic valve regurgitation and moderate mitral valve regurgitation. She has been convalescing initially at [facility] now at [facility] and is concerned about returning home which is necessary due to financial limitations which precludes her from staying at home place. She has enjoyed being at [facility] due to the socialization that she was missing by living alone. She is using a walker and has had no falls. Her only living relative is her niece, dental walker, who is very involved with her care and brings her to each visit.   Past Medical History  Diagnosis Date  . Anemia   . A-fib   . Atrial fibrillation   . Pain in joint, pelvic region and thigh   . Paget's disease     of left breast, nipple removed 2005  . H/O: GI bleed     lower while on coumadin, s/p 6 units PRBCs  . Thyroid disease   . Lower GI bleed     hx  of while on coumadine..s/p 6 units PRBC'S  . Hypertension     Past Surgical History  Procedure Laterality Date  . Cholecystectomy  1999  . Left renal fixation surgery   1947  . Thyroidectomy    . Fetal blood transfusion      for diverticulitis & GI bleed       The following portions of the patient's history were reviewed and updated as appropriate: Allergies, current medications, and problem list.    Review of Systems:   12 Pt  review of systems was negative except those addressed in the HPI,     History   Social History  . Marital Status: Widowed    Spouse Name: N/A    Number of Children: N/A  . Years of Education: N/A   Occupational History  . Not on file.   Social History Main Topics  . Smoking status: Never Smoker   . Smokeless tobacco: Never Used  . Alcohol Use: No  . Drug Use: No  . Sexually Active: Not on file   Other Topics Concern  . Not on file   Social History Narrative   [facility] at [facility] community    Objective:  BP 122/72  Pulse 71  Temp(Src) 98.1 F (36.7 C) (Oral)  Resp 12  Wt 191 lb 4 oz (86.75 kg)  BMI 31.33 kg/m2  SpO2 96%  General appearance: alert,  cooperative and appears stated age Ears: normal TM's and external ear canals both ears Throat: lips, mucosa, and tongue normal; teeth and gums normal Neck: no adenopathy, no carotid bruit, supple, symmetrical, trachea midline and thyroid not enlarged, symmetric, no tenderness/mass/nodules Back: symmetric, no curvature. ROM normal. No CVA tenderness. Lungs: clear to auscultation bilaterally Heart: regular rate and rhythm, S1, S2 normal, no murmur, click, rub or gallop Abdomen: soft, non-tender; bowel sounds normal; no masses,  no organomegaly Pulses: 2+ and symmetric Skin: Skin color, texture, turgor normal. No rashes or lesions Lymph nodes: Cervical, supraclavicular, and axillary nodes normal.  Assessment and Plan:  Malaise Now attributed to severe  cardiomyopathy.  Chronic systolic CHF (congestive heart failure) She is currently well compensated and on the appropriate medications.  A total of 30 minutes was spent with patient more than half of which was spent in counseling, reviewing records from other prviders and coordination of care.  Updated Medication List Outpatient Encounter Prescriptions as of 10/25/2012  Medication Sig Dispense Refill  . aspirin 81 MG chewable tablet Chew 81 mg by mouth daily.      . AZELASTINE-FLUTICASONE NA Place 1 spray into the nose 2 (two) times daily as needed.      Marland Kitchen escitalopram (LEXAPRO) 10 MG tablet TAKE ONE (1) TABLET EACH DAY  30 tablet  5  . furosemide (LASIX) 40 MG tablet Take 40 mg by mouth 2 (two) times daily.      . Ipratropium-Albuterol (COMBIVENT IN) Inhale 1 puff into the lungs 4 (four) times daily.      Marland Kitchen levothyroxine (SYNTHROID, LEVOTHROID) 137 MCG tablet TAKE ONE (1) TABLET EACH DAY  90 tablet  2  . Magnesium Hydroxide (MILK OF MAGNESIA PO) Take 30 mLs by mouth as needed.      Marland Kitchen morphine (ROXANOL) 20 MG/ML concentrated solution Take 20 mg by mouth.      . pantoprazole (PROTONIX) 40 MG tablet TAKE ONE (1) TABLET EACH DAY  90 tablet  2  . Potassium Chloride ER 20 MEQ TBCR Take 1 tablet by mouth daily.  30 tablet  5  . traMADol (ULTRAM) 50 MG tablet Take 1 tablet (50 mg total) by mouth 2 (two) times daily as needed for pain.  60 tablet  5  . zolpidem (AMBIEN) 5 MG tablet Take 1 tablet (5 mg total) by mouth at bedtime as needed for sleep.  30 tablet  5  . acetaminophen (TYLENOL) 325 MG tablet Take 650 mg by mouth every 6 (six) hours as needed for pain.      . metoprolol succinate (TOPROL-XL) 25 MG 24 hr tablet Take 25 mg by mouth daily.      . nitroGLYCERIN (NITROSTAT) 0.4 MG SL tablet Place 1 tablet (0.4 mg total) under the tongue every 5 (five) minutes as needed.  25 tablet  6   No facility-administered encounter medications on file as of 10/25/2012.     No orders of the defined types  were placed in this encounter.    No Follow-up on file.

## 2012-10-26 NOTE — Telephone Encounter (Signed)
Lelon Mast called from facility and wanted to know since patient Lasix was increased to 40 mg BID should the potassium be increased as well to BID?

## 2012-10-26 NOTE — Assessment & Plan Note (Signed)
Now attributed to severe cardiomyopathy.

## 2012-10-26 NOTE — Telephone Encounter (Signed)
Since i did not increase her lasix to bid,  i am not the one to ask. She will have to ask the cardiologist or whoever increased it, but it wasn;t me and i don't havre  A BMET to go by since June

## 2012-10-26 NOTE — Assessment & Plan Note (Signed)
She is currently well compensated and on the appropriate medications.

## 2012-10-27 NOTE — Telephone Encounter (Signed)
Grace Bennett notified to verify with cardiologist

## 2012-11-01 ENCOUNTER — Other Ambulatory Visit: Payer: Self-pay | Admitting: *Deleted

## 2012-11-01 NOTE — Telephone Encounter (Signed)
Also pharmacy called and wanted refill on Lexapro I have called this in as written in mar.

## 2012-11-01 NOTE — Telephone Encounter (Signed)
Chart states historical med. Just wanted to make sure patient is to continue have already set up so will go to long term care pharmacy. Hospice called and stated they are having trouble getting this medication filled this is the first request I have received.

## 2012-11-02 ENCOUNTER — Other Ambulatory Visit: Payer: Self-pay | Admitting: Internal Medicine

## 2012-11-02 DIAGNOSIS — K5900 Constipation, unspecified: Secondary | ICD-10-CM | POA: Insufficient documentation

## 2012-11-02 MED ORDER — POLYETHYLENE GLYCOL 3350 17 GM/SCOOP PO POWD: ORAL | Status: DC

## 2012-11-02 NOTE — Telephone Encounter (Signed)
30 day refill only,  Needs office visit, CMET and fasting lipids prior to any more refills

## 2012-11-02 NOTE — Telephone Encounter (Signed)
Disregard last message,  Wrong chart,. (auto advance_ strikes again )

## 2012-11-06 ENCOUNTER — Other Ambulatory Visit: Payer: Self-pay | Admitting: *Deleted

## 2012-11-06 MED ORDER — PANTOPRAZOLE SODIUM 40 MG PO TBEC: DELAYED_RELEASE_TABLET | ORAL | Status: DC

## 2012-11-06 NOTE — Telephone Encounter (Signed)
Script sent as requested. 

## 2012-11-09 ENCOUNTER — Telehealth: Payer: Self-pay | Admitting: *Deleted

## 2012-11-09 NOTE — Telephone Encounter (Signed)
Letter on printer to send to facility re: increasing her lasix dose foe the next 24 hours

## 2012-11-09 NOTE — Telephone Encounter (Signed)
Med tech called and reported weight gain recorded weight for 11/08/12 193LB for 11/09/12 197.4 orders state notify MD of more than 2lb gain Please advise.

## 2012-11-09 NOTE — Telephone Encounter (Signed)
Letter faxed to facility.

## 2012-11-15 ENCOUNTER — Other Ambulatory Visit: Payer: Self-pay | Admitting: *Deleted

## 2012-11-16 MED ORDER — LEVOTHYROXINE SODIUM 137 MCG PO TABS: ORAL_TABLET | ORAL | Status: AC

## 2012-11-20 ENCOUNTER — Ambulatory Visit: Payer: Self-pay | Admitting: Nurse Practitioner

## 2012-11-27 ENCOUNTER — Encounter: Payer: Self-pay | Admitting: Cardiovascular Disease

## 2012-11-27 ENCOUNTER — Ambulatory Visit: Payer: Medicare Other | Admitting: Cardiovascular Disease

## 2012-11-27 ENCOUNTER — Ambulatory Visit (INDEPENDENT_AMBULATORY_CARE_PROVIDER_SITE_OTHER): Admitting: Cardiovascular Disease

## 2012-11-27 VITALS — BP 118/68 | HR 85 | Ht 65.5 in | Wt 201.5 lb

## 2012-11-27 DIAGNOSIS — I5022 Chronic systolic (congestive) heart failure: Secondary | ICD-10-CM

## 2012-11-27 DIAGNOSIS — I4891 Unspecified atrial fibrillation: Secondary | ICD-10-CM

## 2012-11-27 DIAGNOSIS — I2510 Atherosclerotic heart disease of native coronary artery without angina pectoris: Secondary | ICD-10-CM

## 2012-11-27 DIAGNOSIS — R609 Edema, unspecified: Secondary | ICD-10-CM

## 2012-11-27 DIAGNOSIS — R0602 Shortness of breath: Secondary | ICD-10-CM

## 2012-11-27 DIAGNOSIS — I509 Heart failure, unspecified: Secondary | ICD-10-CM

## 2012-11-27 DIAGNOSIS — I10 Essential (primary) hypertension: Secondary | ICD-10-CM

## 2012-11-27 NOTE — Assessment & Plan Note (Signed)
Blood pressure is well controlled on today's visit. No changes made to the medications. 

## 2012-11-27 NOTE — Assessment & Plan Note (Signed)
Mild edema on today's visit, weight up 10 pounds. Asked her to watch her salt intake, fluid intake, stay on her Lasix. Asked her to watch her weight and call her office if it continues to trend upwards.

## 2012-11-27 NOTE — Assessment & Plan Note (Addendum)
Low ejection fraction, likely nonischemic. She does not want workup including stress test or catheterization. We'll treat medically. We'll continue high-dose diuretics. Weight is up 10 pounds from her prior clinic visit. Trace edema on exam. Suspect she's not taking Lasix on a regular basis at home. Recommend she take this daily if not twice a day. She reports that she does not drink much.

## 2012-11-27 NOTE — Patient Instructions (Addendum)
At your request, no warfarin/coumadin will be started, or other blood thinners for atrial fibrillation There is a small risk of CVA without a blood thinner Also there is a risk of bleeding on a blood thinner  For fast heart rhythm/speed, take an extra metoprolol  Would continue lasix with potassium daily for low heart failure,  Your weight is up 10 pounds (water weight)  For worsening leg edema/swelling, take extra lasix/furosemide with potassium  Please call us if you have new issues that need to be addressed before your next appt.  Your physician wants you to follow-up in: 3 months.  You will receive a reminder letter in the mail two months in advance. If you don't receive a letter, please call our office to schedule the follow-up appointment.

## 2012-11-27 NOTE — Progress Notes (Signed)
Patient ID: Grace Bennett, female    DOB: [birth year], 79 y.o.   MRN: [id number]  HPI Comments: Grace Bennett is a very pleasant 79 year old woman, patient of Dr. Darrick Huntsman, with history of atrial fibrillation, coronary artery disease with angioplasty in 1995, mild carotid arterial disease, thyroid cancer with resection currently on thyroid supplementation, insomnia,   chest pain episodes, chronic general malaise and depression, gait instability who presents for routine followup. She has refused cardiac workup in the past. History  of falls, has had weak legs from any years. She was in normal sinus rhythm in February 2014 converted to atrial fibrillation likely in the setting of GI issues or bronchitis,    hospital admission on 08/19/2012 with discharge 08/21/2012.to [facility]. She had a "stomach virus", then had bronchitis and developed "CHF".  Acute on chronic diastolic CHF diagnosed . Echocardiogram showed ejection fraction 20-25% on 09/05/2012. Moderate LVH, moderately dilated left atrium, moderate MR, right ventricular systolic pressures estimated at 40 mmHg  Prior echocardiogram  in 2012 showed ejection fraction 50-55%.  She did a long period of rehabilitation at [facility], and [facility] at the [facility]  Initially was discharged home on Lasix 20 mg every other day. This was increased to 40 mg twice a day As with her last clinic visit, She feels tired, unable to walk for a car. She is frustrated that her legs are getting better. low energy.  She's not interested in any further workup for her atrial fibrillation she does report having a day last week with a "seizure", with shaking, chills. Symptoms resolved on their own without intervention after one day.  Sleep is poor at times, sometimes waking up at 3 or 4 in the morning, sleeping in the daytime. Long discussion today as on previous visits about anticoagulation. Family present. She does not want anticoagulation of any kind. She reports history of GI  bleed several years ago. She was told she had diverticuli .  EKG shows  atrial fibrillation,  left bundle branch block , heart rate 85 beats per minute,    Outpatient Encounter Prescriptions as of 11/27/2012  Medication Sig Dispense Refill  . acetaminophen (TYLENOL) 325 MG tablet Take 650 mg by mouth every 6 (six) hours as needed for pain.      Marland Kitchen aspirin 81 MG chewable tablet Chew 81 mg by mouth daily.      . AZELASTINE-FLUTICASONE NA Place 1 spray into the nose 2 (two) times daily as needed.      Marland Kitchen escitalopram (LEXAPRO) 10 MG tablet TAKE ONE (1) TABLET EACH DAY  30 tablet  5  . furosemide (LASIX) 40 MG tablet Take 40 mg by mouth 2 (two) times daily.      . Ipratropium-Albuterol (COMBIVENT IN) Inhale 1 puff into the lungs 4 (four) times daily.      Marland Kitchen levothyroxine (SYNTHROID, LEVOTHROID) 137 MCG tablet TAKE ONE (1) TABLET EACH DAY  90 tablet  2  . Magnesium Hydroxide (MILK OF MAGNESIA PO) Take 30 mLs by mouth as needed.      . metoprolol succinate (TOPROL-XL) 25 MG 24 hr tablet Take 25 mg by mouth daily.      Marland Kitchen morphine (ROXANOL) 20 MG/ML concentrated solution Take 20 mg by mouth.      . nitroGLYCERIN (NITROSTAT) 0.4 MG SL tablet Place 1 tablet (0.4 mg total) under the tongue every 5 (five) minutes as needed.  25 tablet  6  . pantoprazole (PROTONIX) 40 MG tablet TAKE ONE (1) TABLET EACH DAY  90 tablet  2  . polyethylene glycol powder (MIRALAX) powder Mix in 4 ounces of water and take daily to prevent constipation  527 g  3  . Potassium Chloride ER 20 MEQ TBCR Take 1 tablet by mouth daily.  30 tablet  5  . traMADol (ULTRAM) 50 MG tablet Take 1 tablet (50 mg total) by mouth 2 (two) times daily as needed for pain.  60 tablet  5  . zolpidem (AMBIEN) 5 MG tablet Take 1 tablet (5 mg total) by mouth at bedtime as needed for sleep.  30 tablet  5  . [DISCONTINUED] pantoprazole (PROTONIX) 40 MG tablet TAKE ONE (1) TABLET EACH DAY  90 tablet  2   No facility-administered encounter medications on file as  of 11/27/2012.    Review of Systems  Constitutional: Positive for fatigue.  HENT: Negative.   Eyes: Negative.   Respiratory: Positive for shortness of breath.   Cardiovascular: Negative.   Gastrointestinal: Negative.   Musculoskeletal: Positive for gait problem.  Skin: Negative.   Neurological: Positive for weakness.  Psychiatric/Behavioral: Negative.   All other systems reviewed and are negative.    BP 118/68  Pulse 85  Ht 5' 5.5" (1.664 m)  Wt 201 lb 8 oz (91.4 kg)  BMI 33.01 kg/m2  Physical Exam  Nursing note and vitals reviewed. Constitutional: She is oriented to person, place, and time. She appears well-developed and well-nourished.  Elderly woman in no apparent distress, sitting in a wheelchair  HENT:  Head: Normocephalic.  Nose: Nose normal.  Mouth/Throat: Oropharynx is clear and moist.  Eyes: Conjunctivae are normal. Pupils are equal, round, and reactive to light.  Neck: Normal range of motion. Neck supple. No JVD present.  Cardiovascular: Normal rate, regular rhythm, S1 normal, S2 normal, normal heart sounds and intact distal pulses.  Exam reveals no gallop and no friction rub.   No murmur heard. Pulmonary/Chest: Effort normal and breath sounds normal. No respiratory distress. She has no wheezes. She has no rales. She exhibits no tenderness.  Abdominal: Soft. Bowel sounds are normal. She exhibits no distension. There is no tenderness.  Musculoskeletal: Normal range of motion. She exhibits no edema and no tenderness.  Lymphadenopathy:    She has no cervical adenopathy.  Neurological: She is alert and oriented to person, place, and time. Coordination normal.  Skin: Skin is warm and dry. No rash noted. No erythema.  Psychiatric: She has a normal mood and affect. Her behavior is normal. Judgment and thought content normal.    Assessment and Plan

## 2012-11-27 NOTE — Assessment & Plan Note (Signed)
Currently with no symptoms of angina. No further workup at this time. Continue current medication regimen. 

## 2012-11-27 NOTE — Assessment & Plan Note (Signed)
Rate is reasonably , no changes made to her medications. She is not interested anticoagulation. She understands the risk of stroke. This was also discussed with her son who is present.

## 2012-12-20 ENCOUNTER — Telehealth: Payer: Self-pay | Admitting: *Deleted

## 2012-12-20 NOTE — Telephone Encounter (Signed)
Hospice needs verbal for flu -shot.

## 2012-12-20 NOTE — Telephone Encounter (Signed)
Verbal auth for flu shot given

## 2012-12-22 NOTE — Telephone Encounter (Signed)
Called hospice verbal given

## 2013-01-07 ENCOUNTER — Inpatient Hospital Stay: Payer: Self-pay | Admitting: Internal Medicine

## 2013-01-07 LAB — URINALYSIS, COMPLETE
Bilirubin,UR: NEGATIVE
Blood: NEGATIVE
Glucose,UR: NEGATIVE mg/dL (ref 0–75)
Ketone: NEGATIVE
Nitrite: NEGATIVE
Ph: 5 (ref 4.5–8.0)
Protein: NEGATIVE
RBC,UR: 1 /HPF (ref 0–5)
Specific Gravity: 1.024 (ref 1.003–1.030)
WBC UR: 5 /HPF (ref 0–5)

## 2013-01-07 LAB — CBC
HGB: 10.3 g/dL — ABNORMAL LOW (ref 12.0–16.0)
MCHC: 33 g/dL (ref 32.0–36.0)
MCV: 86 fL (ref 80–100)
Platelet: 173 10*3/uL (ref 150–440)
RDW: 15.2 % — ABNORMAL HIGH (ref 11.5–14.5)
WBC: 6.6 10*3/uL (ref 3.6–11.0)

## 2013-01-07 LAB — COMPREHENSIVE METABOLIC PANEL
Albumin: 3 g/dL — ABNORMAL LOW (ref 3.4–5.0)
Anion Gap: 5 — ABNORMAL LOW (ref 7–16)
BUN: 22 mg/dL — ABNORMAL HIGH (ref 7–18)
Bilirubin,Total: 0.7 mg/dL (ref 0.2–1.0)
Calcium, Total: 8 mg/dL — ABNORMAL LOW (ref 8.5–10.1)
Chloride: 107 mmol/L (ref 98–107)
Creatinine: 1.22 mg/dL (ref 0.60–1.30)
EGFR (African American): 45 — ABNORMAL LOW
EGFR (Non-African Amer.): 38 — ABNORMAL LOW
SGPT (ALT): 12 U/L (ref 12–78)
Total Protein: 6.6 g/dL (ref 6.4–8.2)

## 2013-01-07 LAB — TROPONIN I: Troponin-I: <0.02 ng/mL

## 2013-01-07 LAB — HEMOGLOBIN: HGB: 10.1 g/dL — ABNORMAL LOW (ref 12.0–16.0)

## 2013-01-07 LAB — CBC WITH DIFFERENTIAL/PLATELET
Basophil #: 0.1 10*3/uL (ref 0.0–0.1)
Basophil %: 0.7 %
Eosinophil #: 0.2 10*3/uL (ref 0.0–0.7)
Eosinophil %: 2.4 %
HGB: 9.1 g/dL — ABNORMAL LOW (ref 12.0–16.0)
Lymphocyte %: 27.7 %
MCH: 28.3 pg (ref 26.0–34.0)
Monocyte #: 0.8 x10 3/mm (ref 0.2–0.9)
Monocyte %: 11.6 %
Platelet: 161 10*3/uL (ref 150–440)
RDW: 15.3 % — ABNORMAL HIGH (ref 11.5–14.5)
WBC: 7.2 10*3/uL (ref 3.6–11.0)

## 2013-01-07 LAB — LIPASE, BLOOD: Lipase: 171 U/L (ref 73–393)

## 2013-01-07 LAB — APTT: Activated PTT: 35.4 secs (ref 23.6–35.9)

## 2013-01-07 LAB — PROTIME-INR: INR: 1.2

## 2013-01-08 LAB — CBC WITH DIFFERENTIAL/PLATELET
Basophil #: 0.1 10*3/uL (ref 0.0–0.1)
Basophil %: 0.8 %
Eosinophil %: 3 %
HGB: 9.1 g/dL — ABNORMAL LOW (ref 12.0–16.0)
Lymphocyte #: 1.9 10*3/uL (ref 1.0–3.6)
MCH: 28.2 pg (ref 26.0–34.0)
MCHC: 32.6 g/dL (ref 32.0–36.0)
MCV: 87 fL (ref 80–100)
Monocyte #: 0.8 x10 3/mm (ref 0.2–0.9)
Neutrophil #: 4 10*3/uL (ref 1.4–6.5)
Platelet: 161 10*3/uL (ref 150–440)
RBC: 3.21 10*6/uL — ABNORMAL LOW (ref 3.80–5.20)
RDW: 15 % — ABNORMAL HIGH (ref 11.5–14.5)
WBC: 6.9 10*3/uL (ref 3.6–11.0)

## 2013-01-08 LAB — BASIC METABOLIC PANEL
Calcium, Total: 8 mg/dL — ABNORMAL LOW (ref 8.5–10.1)
Co2: 31 mmol/L (ref 21–32)
Creatinine: 1.05 mg/dL (ref 0.60–1.30)
EGFR (African American): 53 — ABNORMAL LOW
EGFR (Non-African Amer.): 46 — ABNORMAL LOW
Osmolality: 288 (ref 275–301)
Potassium: 3.4 mmol/L — ABNORMAL LOW (ref 3.5–5.1)
Sodium: 144 mmol/L (ref 136–145)

## 2013-01-08 LAB — HEMATOCRIT: HCT: 27.7 % — ABNORMAL LOW (ref 35.0–47.0)

## 2013-01-08 LAB — HEMOGLOBIN: HGB: 8.5 g/dL — ABNORMAL LOW (ref 12.0–16.0)

## 2013-01-09 LAB — HEMOGLOBIN
HGB: 7.9 g/dL — ABNORMAL LOW (ref 12.0–16.0)
HGB: 8.5 g/dL — ABNORMAL LOW (ref 12.0–16.0)

## 2013-01-10 ENCOUNTER — Telehealth: Payer: Self-pay | Admitting: Internal Medicine

## 2013-01-10 NOTE — Telephone Encounter (Signed)
[facility] called and scheduled a Hospital f/u for next Wednesday 01/17/13

## 2013-01-10 NOTE — Telephone Encounter (Signed)
Please call patient ASAP and follow the hospital follow up protocol  (contact must be made with patient within 24 hours of discharge, etc).    Grace Bennett please review this with staff so these calls are handled and routed in the most expeditious manner.

## 2013-01-10 NOTE — Telephone Encounter (Signed)
FYI

## 2013-01-11 NOTE — Telephone Encounter (Signed)
Talked with patient care giver patient has an appointment for Hospital follow up on 10/29/ 14 , Patient caregiver repoRrts one dark stool on day of discharge.FYI faxed for medical records for discharge summary.

## 2013-01-15 ENCOUNTER — Other Ambulatory Visit: Payer: Self-pay | Admitting: Internal Medicine

## 2013-01-15 MED ORDER — MORPHINE SULFATE (CONCENTRATE) 20 MG/ML PO SOLN: 5.0000 mg | ORAL | Status: AC | PRN

## 2013-01-16 ENCOUNTER — Encounter: Payer: Self-pay | Admitting: *Deleted

## 2013-01-17 ENCOUNTER — Ambulatory Visit (INDEPENDENT_AMBULATORY_CARE_PROVIDER_SITE_OTHER): Payer: Medicare Other | Admitting: Internal Medicine

## 2013-01-17 ENCOUNTER — Encounter: Payer: Self-pay | Admitting: Internal Medicine

## 2013-01-17 VITALS — BP 100/64 | HR 80 | Temp 98.0°F | Resp 12

## 2013-01-17 DIAGNOSIS — D509 Iron deficiency anemia, unspecified: Secondary | ICD-10-CM

## 2013-01-17 DIAGNOSIS — K5900 Constipation, unspecified: Secondary | ICD-10-CM

## 2013-01-17 DIAGNOSIS — D62 Acute posthemorrhagic anemia: Secondary | ICD-10-CM

## 2013-01-17 DIAGNOSIS — R609 Edema, unspecified: Secondary | ICD-10-CM

## 2013-01-17 DIAGNOSIS — E876 Hypokalemia: Secondary | ICD-10-CM

## 2013-01-17 DIAGNOSIS — Z8719 Personal history of other diseases of the digestive system: Secondary | ICD-10-CM

## 2013-01-17 NOTE — Progress Notes (Signed)
Patient ID: Grace Bennett, female   DOB: 11-Jun-1918, 79 y.o.   MRN: [id number]  Patient Active Problem List   Diagnosis Date Noted  . History of lower GI bleeding 01/18/2013  . Acute posthemorrhagic anemia 01/18/2013  . Unspecified constipation 11/02/2012  . Chronic systolic CHF (congestive heart failure) 09/21/2012  . Acute bronchitis 08/16/2012  . Edema 05/19/2012  . Orthostasis 01/21/2012  . 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 Depressive disorder 02/21/2011  . A-fib   . Chest pain 09/17/2010  . Malaise 09/17/2010  . CAD (coronary artery disease) 09/17/2010  . HTN (hypertension) 09/17/2010    Subjective:  CC:   Chief Complaint  Patient presents with  . Follow-up    patient was in the hospital-bloody stool-patient also states that she is not breathing well     HPI:   Grace Bennett is a 79 y.o. female who presents  For Hospital follow up for presumed diverticular vs rectal bleeding.    Patient was admitted to [facility] on Oct 19th with recurrent lower GI bleed in the setting of chronic constipation, diverticulosis and ASA therapy for ischemic cardiomyopathy.  She was admitted with hgb of 10.1 , which dropped to 7.9 during admission.  GI consult was done .  No colonoscopy was done due to comorbidities and age,  No transfusion per family and patient request.  Discharged to [facility] with Hospice care on daily miralax, twice daily colace and prn dulcolax, and instructed to resume asa after one week.    She presents today brought by nephew Feliz Beam.  Patient states that she is "half dead."  Not sleeping well due to intolerance of facility bed, broken recliner and fecal inctoinence due to overuse of motility agents. Stomach hurts, headache,  "dont' understand why I dont' feel good."  She slept in roommate's recliner last night  After hers broke.   She is getting alone with her roommate but was not aware that her roommate's lack of conversational dialogue was due to dementia.  She has  had increased LE edema since dc to [facility] but denies dyspnea at rest  She has been too weak to walk since discharge and is using a wheelchair.    Past Medical History  Diagnosis Date  . Anemia   . A-fib   . Atrial fibrillation   . Pain in joint, pelvic region and thigh   . Paget's disease     of left breast, nipple removed 2005  . H/O: GI bleed     lower while on coumadin, s/p 6 units PRBCs  . Thyroid disease   . Lower GI bleed     hx of while on coumadine..s/p 6 units PRBC'S  . Hypertension     Past Surgical History  Procedure Laterality Date  . Cholecystectomy  1999  . Left renal fixation surgery   1947  . Thyroidectomy    . Fetal blood transfusion      for diverticulitis & GI bleed       The following portions of the patient's history were reviewed and updated as appropriate: Allergies, current medications, and problem list.    Review of Systems:   Patient denies headache, fevers,nintentional weight loss, skin rash, eye pain, sinus congestion and sinus pain, sore throat, dysphagia,  hemoptysis , cough, dyspnea, wheezing, chest pain, palpitations, orthopnea, edema, abdominal pain, nausea, melena, diarrhea, constipation, flank pain, dysuria, hematuria, urinary  Frequency, nocturia, numbness, tingling, seizures,  Focal weakness, Loss of consciousness,  Tremor, insomnia, depression, anxiety, and  suicidal ideation.     History   Social History  . Marital Status: Widowed    Spouse Name: N/A    Number of Children: N/A  . Years of Education: N/A   Occupational History  . Not on file.   Social History Main Topics  . Smoking status: Never Smoker   . Smokeless tobacco: Never Used  . Alcohol Use: No  . Drug Use: No  . Sexual Activity: Not on file   Other Topics Concern  . Not on file   Social History Narrative   Lives alone at [facility]     Objective:  Filed Vitals:   01/17/13 1435  BP: 100/64  Pulse: 80  Temp: 98 F (36.7 C)  Resp:  12     General appearance: alert, cooperative and appears stated age.  No respiratory distress Ears: normal TM's and external ear canals both ears Throat: lips, mucosa, and tongue normal; teeth and gums normal Neck: no adenopathy, no carotid bruit, supple, symmetrical, trachea midline and thyroid not enlarged, symmetric, no tenderness/mass/nodules Back: symmetric, no curvature. ROM normal. No CVA tenderness. Lungs: clear to auscultation bilaterally Heart: regular rate and rhythm, S1, S2 normal, no murmur, click, rub or gallop Abdomen: soft, non-tender; bowel sounds normal; no masses,  no organomegaly Pulses: 2+ and symmetric Skin: Skin color, texture, turgor normal. No rashes or lesions. 1+ pitting edeam to mid tibia  Lymph nodes: Cervical, supraclavicular, and axillary nodes normal.  Assessment and Plan:  History of lower GI bleeding With recent admission for same and drop in hgb to 7.9.  Patient has resumed aspirin,.  constipation has been over corrected.  No bleeding since discharge.  No further workup desired. History of diverticulosis. Rechekcing hgb today   Acute posthemorrhagic anemia Her hemoglobin dropped from 10.3 to 7.9 during admission , was 8.5 at discharge on 10/22, and on repeat by outside lab on 10/29 was 7.8.  Iron deficient.  Since she is not short of breath, does not meet criteria for transfusion , unless her weakness is attributed to anemia.  Does not want transfusion but would benefit from IV iron.  Referral to Dr. Wendie Simmer  In process after phone discussion with hematologist. Aspirin stopped . Lab Results  Component Value Date   WBC 8.3 01/17/2013   HGB 7.8 Repeated and verified X2.* 01/17/2013   HCT 24.1 Repeated and verified X2.* 01/17/2013   MCV 84.1 01/17/2013   PLT 228.0 01/17/2013   Lab Results  Component Value Date   IRON 11* 01/17/2013   TIBC 434 01/17/2013   FERRITIN 17.9 01/17/2013    Unspecified constipation Now overcorrected causing fecal  incontinence .  Reducing medications to stool softener only ,  Making miralax prn.   Edema Worsening, without signs of respiratory distress,  Increase lasix for 3 days to bid. Baseline cr is 1.1, potassium is 4.2  today   Lab Results  Component Value Date   CREATININE 1.2 01/17/2013   BUN 15 01/17/2013   NA 140 01/17/2013   K 4.2 01/17/2013   CL 102 01/17/2013   CO2 30 01/17/2013     A total of 40 minutes was spent with patient more than half of which was spent in counseling, reviewing records from other prviders and coordination of care.  Updated Medication List Outpatient Encounter Prescriptions as of 01/17/2013  Medication Sig  . acetaminophen (TYLENOL) 325 MG tablet Take 650 mg by mouth every 6 (six) hours as needed for pain.  Marland Kitchen aspirin 81  MG chewable tablet Chew 81 mg by mouth daily.  . AZELASTINE-FLUTICASONE NA Place 1 spray into the nose 2 (two) times daily as needed.  Marland Kitchen escitalopram (LEXAPRO) 10 MG tablet TAKE ONE (1) TABLET EACH DAY  . furosemide (LASIX) 40 MG tablet Take 40 mg by mouth 2 (two) times daily.  . Ipratropium-Albuterol (COMBIVENT IN) Inhale 1 puff into the lungs 4 (four) times daily.  Marland Kitchen levothyroxine (SYNTHROID, LEVOTHROID) 137 MCG tablet TAKE ONE (1) TABLET EACH DAY  . Magnesium Hydroxide (MILK OF MAGNESIA PO) Take 30 mLs by mouth as needed.  . metoprolol succinate (TOPROL-XL) 25 MG 24 hr tablet Take 25 mg by mouth daily.  Marland Kitchen morphine (ROXANOL) 20 MG/ML concentrated solution Take 0.25 mLs (5 mg total) by mouth every 3 (three) hours as needed for pain.  . nitroGLYCERIN (NITROSTAT) 0.4 MG SL tablet Place 1 tablet (0.4 mg total) under the tongue every 5 (five) minutes as needed.  . pantoprazole (PROTONIX) 40 MG tablet TAKE ONE (1) TABLET EACH DAY  . polyethylene glycol powder (MIRALAX) powder Mix in 4 ounces of water and take daily to prevent constipation  . Potassium Chloride ER 20 MEQ TBCR Take 1 tablet by mouth daily.  . traMADol (ULTRAM) 50 MG tablet Take 1  tablet (50 mg total) by mouth 2 (two) times daily as needed for pain.  Marland Kitchen zolpidem (AMBIEN) 5 MG tablet Take 1 tablet (5 mg total) by mouth at bedtime as needed for sleep.     Orders Placed This Encounter  Procedures  . CBC with Differential  . Ferritin  . Iron and TIBC  . Basic metabolic panel  . Ambulatory referral to Hematology    Return in about 3 months (around 04/19/2013).

## 2013-01-18 ENCOUNTER — Ambulatory Visit: Payer: Self-pay | Admitting: Hematology and Oncology

## 2013-01-18 ENCOUNTER — Telehealth: Payer: Self-pay | Admitting: Internal Medicine

## 2013-01-18 ENCOUNTER — Encounter: Payer: Self-pay | Admitting: Internal Medicine

## 2013-01-18 ENCOUNTER — Telehealth: Payer: Self-pay | Admitting: *Deleted

## 2013-01-18 ENCOUNTER — Telehealth: Payer: Self-pay | Admitting: Emergency Medicine

## 2013-01-18 DIAGNOSIS — D62 Acute posthemorrhagic anemia: Secondary | ICD-10-CM | POA: Insufficient documentation

## 2013-01-18 DIAGNOSIS — Z8719 Personal history of other diseases of the digestive system: Secondary | ICD-10-CM | POA: Insufficient documentation

## 2013-01-18 LAB — BASIC METABOLIC PANEL
Chloride: 102 mEq/L (ref 96–112)
GFR: 44.61 mL/min — ABNORMAL LOW (ref >60.00)
Potassium: 4.2 mEq/L (ref 3.5–5.1)
Sodium: 140 mEq/L (ref 135–145)

## 2013-01-18 LAB — CBC WITH DIFFERENTIAL/PLATELET
Basophils Relative: 0.4 % (ref 0.0–3.0)
HCT: 24.1 % — ABNORMAL LOW (ref 36.0–46.0)
Hemoglobin: 7.8 g/dL — CL (ref 12.0–15.0)
Lymphocytes Relative: 21.3 % (ref 12.0–46.0)
Lymphs Abs: 1.8 10*3/uL (ref 0.7–4.0)
MCHC: 32.3 g/dL (ref 30.0–36.0)
Monocytes Relative: 9.4 % (ref 3.0–12.0)
Neutro Abs: 5.6 10*3/uL (ref 1.4–7.7)
RBC: 2.87 Mil/uL — ABNORMAL LOW (ref 3.87–5.11)

## 2013-01-18 LAB — IRON AND TIBC
TIBC: 434 ug/dL (ref 250–470)
UIBC: 423 ug/dL — ABNORMAL HIGH (ref 125–400)

## 2013-01-18 NOTE — Assessment & Plan Note (Addendum)
Now overcorrected causing fecal incontinence .  Reducing medications to stool softener only ,  Making miralax prn.

## 2013-01-18 NOTE — Telephone Encounter (Signed)
Still cannot reach patient she is at lunch in dinning room, talked Ms. Walker and was told she had talked with the Hospitalist that saw patient while at hospital and was advised by Dr. Santiago Bur that the iron transfusion at the cancer center would be better with the things that are going around in the ER and that if she went to ER they would probably admit patient and they do not want this if it can be avoided.

## 2013-01-18 NOTE — Telephone Encounter (Signed)
I rechecked her hemoglobin yesterday and it has fallen even lower since she left the hospital,  It is now 7.8.  If she is still seeing blood in her stools or is feeling dizzy when she stands up. She needs a transfusion  Of blood.  She will need to go to the Er if she is still bleeding.  If not we can try  To get it done along iwht an iron transfusion through the cancer center. Please call patient and find out the above

## 2013-01-18 NOTE — Telephone Encounter (Signed)
Tried three times  to call patient no answer left voicemail to return call to office, Called patient DPR and was advised she would like to confer with Hospice before proceeding with any further as to patient going to ER or patient having the transfusion at cancer center along with iron . Dpr is to return my call.

## 2013-01-18 NOTE — Assessment & Plan Note (Signed)
Worsening, without signs of respiratory distress,  Increase lasix for 3 days to bid. Baseline cr is 1.1, potassium is 4.2  today   Lab Results  Component Value Date   CREATININE 1.2 01/17/2013   BUN 15 01/17/2013   NA 140 01/17/2013   K 4.2 01/17/2013   CL 102 01/17/2013   CO2 30 01/17/2013

## 2013-01-18 NOTE — Telephone Encounter (Signed)
[facility] called and said they rec'd a fax asking to d/c pts aspirin 81 MG chewable tablet. Pt not taking this medication.

## 2013-01-18 NOTE — Assessment & Plan Note (Addendum)
Her hemoglobin dropped from 10.3 to 7.9 during admission , was 8.5 at discharge on 10/22, and on repeat by outside lab on 10/29 was 7.8.  Iron deficient.  Since she is not short of breath, does not meet criteria for transfusion , unless her weakness is attributed to anemia.  Does not want transfusion but would benefit from IV iron.  Referral to Dr. Wendie Simmer  In process after phone discussion with hematologist.  Lab Results  Component Value Date   WBC 8.3 01/17/2013   HGB 7.8 Repeated and verified X2.* 01/17/2013   HCT 24.1 Repeated and verified X2.* 01/17/2013   MCV 84.1 01/17/2013   PLT 228.0 01/17/2013   Lab Results  Component Value Date   IRON 11* 01/17/2013   TIBC 434 01/17/2013   FERRITIN 17.9 01/17/2013

## 2013-01-18 NOTE — Telephone Encounter (Signed)
Critical Lab: Hgb-7.8

## 2013-01-18 NOTE — Assessment & Plan Note (Signed)
With recent admission for same and drop in hgb to 7.9.  Patient has resumed aspirin,.  constipation has been over corrected.  No bleeding since discharge.  No further workup desired. History of diverticulosis. Rechekcing hgb today

## 2013-01-18 NOTE — Telephone Encounter (Signed)
If patient is not bleeding or unstable, she does not need to go to the Er.  Her hemoglbin dropped to 7.9 when she was in the hospital and they did not tranfuse her.  We can arrange to have an iron infusion at the cancer center .  I am in the process of seeing whtst involved in getting that done. If she is short of breath, that would be criteria for giving her a unit of blood as well.

## 2013-01-18 NOTE — Telephone Encounter (Signed)
Ok, will  Have amber proceed with referral for IV iron transfusion at the [facility]

## 2013-01-19 NOTE — Telephone Encounter (Signed)
Orders faxed to [facility] to aspirin on 01/18/13 and for IV Iron infusion to performed at Cancer center.

## 2013-01-19 NOTE — Telephone Encounter (Signed)
FYI

## 2013-01-21 DIAGNOSIS — I2510 Atherosclerotic heart disease of native coronary artery without angina pectoris: Secondary | ICD-10-CM

## 2013-01-21 DIAGNOSIS — I4891 Unspecified atrial fibrillation: Secondary | ICD-10-CM

## 2013-01-21 DIAGNOSIS — R1312 Dysphagia, oropharyngeal phase: Secondary | ICD-10-CM

## 2013-01-21 DIAGNOSIS — I5033 Acute on chronic diastolic (congestive) heart failure: Secondary | ICD-10-CM

## 2013-01-24 ENCOUNTER — Other Ambulatory Visit: Payer: Self-pay | Admitting: *Deleted

## 2013-01-24 NOTE — Telephone Encounter (Signed)
Refill Request  Doc-Q-Lax 50-8.6 mg   #30   Take 1 tablet by mouth twice daily for constipation

## 2013-01-24 NOTE — Telephone Encounter (Signed)
I do not see the medication listed below by Eber Jones on patient med list, please advise.

## 2013-01-25 ENCOUNTER — Other Ambulatory Visit: Payer: Self-pay

## 2013-01-25 MED ORDER — ESCITALOPRAM OXALATE 10 MG PO TABS: ORAL_TABLET | ORAL | Status: AC

## 2013-02-07 ENCOUNTER — Telehealth: Payer: Self-pay | Admitting: *Deleted

## 2013-02-07 MED ORDER — LORAZEPAM 0.5 MG PO TABS: 0.5000 mg | ORAL_TABLET | ORAL | Status: AC | PRN

## 2013-02-07 NOTE — Telephone Encounter (Signed)
Called stated she had faxed over order for lorazepam for Grace Bennett advised we have received and in red folder.

## 2013-02-08 DIAGNOSIS — Z8719 Personal history of other diseases of the digestive system: Secondary | ICD-10-CM

## 2013-02-08 DIAGNOSIS — E876 Hypokalemia: Secondary | ICD-10-CM

## 2013-02-08 DIAGNOSIS — R609 Edema, unspecified: Secondary | ICD-10-CM

## 2013-02-08 DIAGNOSIS — K5900 Constipation, unspecified: Secondary | ICD-10-CM

## 2013-02-08 DIAGNOSIS — D509 Iron deficiency anemia, unspecified: Secondary | ICD-10-CM

## 2013-02-12 ENCOUNTER — Telehealth: Payer: Self-pay | Admitting: *Deleted

## 2013-02-12 ENCOUNTER — Other Ambulatory Visit: Payer: Self-pay | Admitting: Internal Medicine

## 2013-02-12 ENCOUNTER — Other Ambulatory Visit: Payer: Self-pay | Admitting: *Deleted

## 2013-02-12 MED ORDER — SORBITOL 70 % PO SOLN: ORAL | Status: AC

## 2013-02-12 MED ORDER — PANTOPRAZOLE SODIUM 40 MG PO TBEC: DELAYED_RELEASE_TABLET | ORAL | Status: AC

## 2013-02-12 MED ORDER — IPRATROPIUM-ALBUTEROL 0.5-2.5 (3) MG/3ML IN SOLN: RESPIRATORY_TRACT | Status: AC

## 2013-02-12 MED ORDER — POLYETHYLENE GLYCOL 3350 17 GM/SCOOP PO POWD: ORAL | Status: AC

## 2013-02-12 NOTE — Telephone Encounter (Signed)
DOC-Q-LAX 50/8.6 MG TABLETS  #30  TAKE 1 TABLET BY MOUTH TWICE DAILY AS NEEDED FOR CONSTIPATION

## 2013-02-13 NOTE — Telephone Encounter (Signed)
It's a laxative probably prescribed by Hospice.  Ok to refill

## 2013-02-13 NOTE — Telephone Encounter (Signed)
Please advise if patient is to receive this medication? I Do not see in chart or notes.

## 2013-02-20 ENCOUNTER — Other Ambulatory Visit: Payer: Self-pay | Admitting: Internal Medicine

## 2013-02-20 MED ORDER — OXYCODONE-ACETAMINOPHEN 5-325 MG PO TABS: ORAL_TABLET | ORAL | Status: DC

## 2013-02-20 MED ORDER — OXYCODONE-ACETAMINOPHEN 5-325 MG PO TABS: ORAL_TABLET | ORAL | Status: AC

## 2013-03-01 ENCOUNTER — Ambulatory Visit: Payer: Medicare Other | Admitting: Cardiovascular Disease

## 2013-03-22 DEATH — deceased

## 2013-09-19 NOTE — Telephone Encounter (Signed)
This encounter was created in error - please disregard.

## 2013-10-09 IMAGING — CR DG ABDOMEN 3V
1 series · 5 of 5 positions shown · non-contrast
Comparison: none

REASON FOR EXAM: abd pain constipation
COMMENTS:

[Series 1: w chest pa · 0.14mm/px · 5 of 5 slices shown]
[im 1/5]
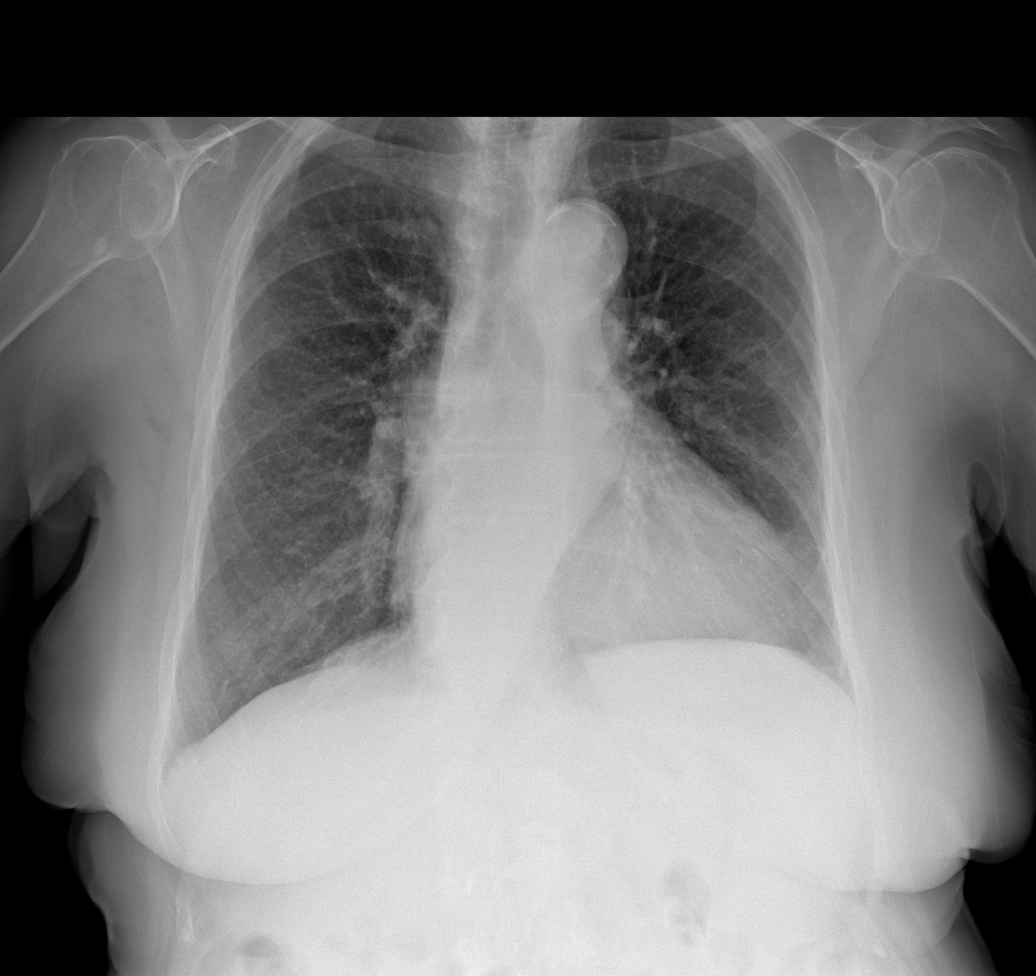
[im 2/5]
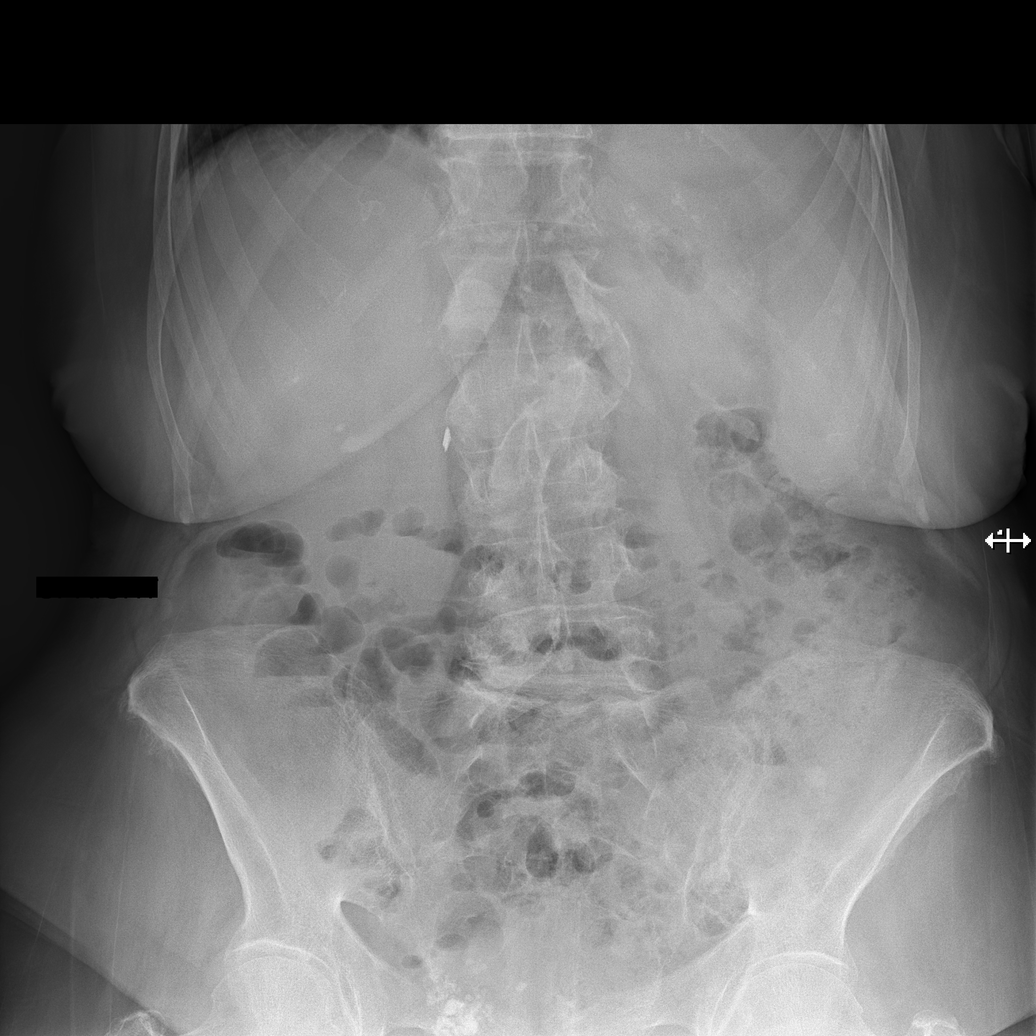
[im 3/5]
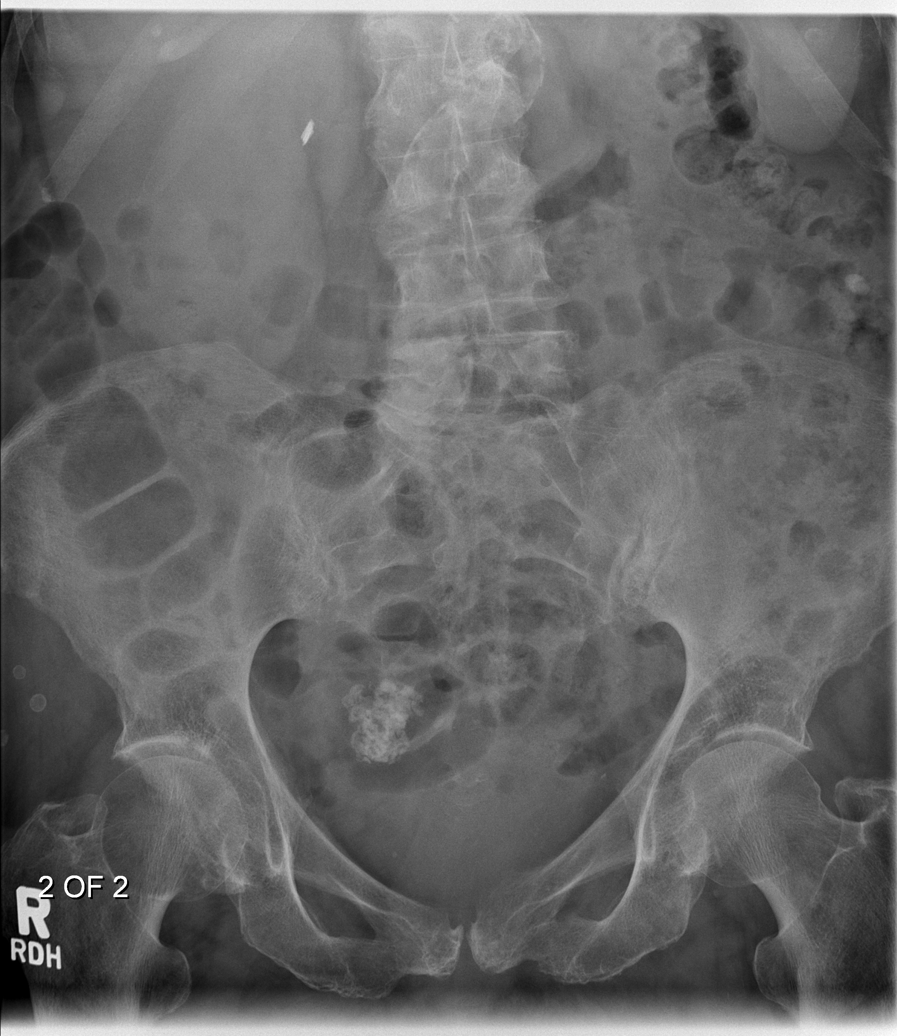
[im 4/5]
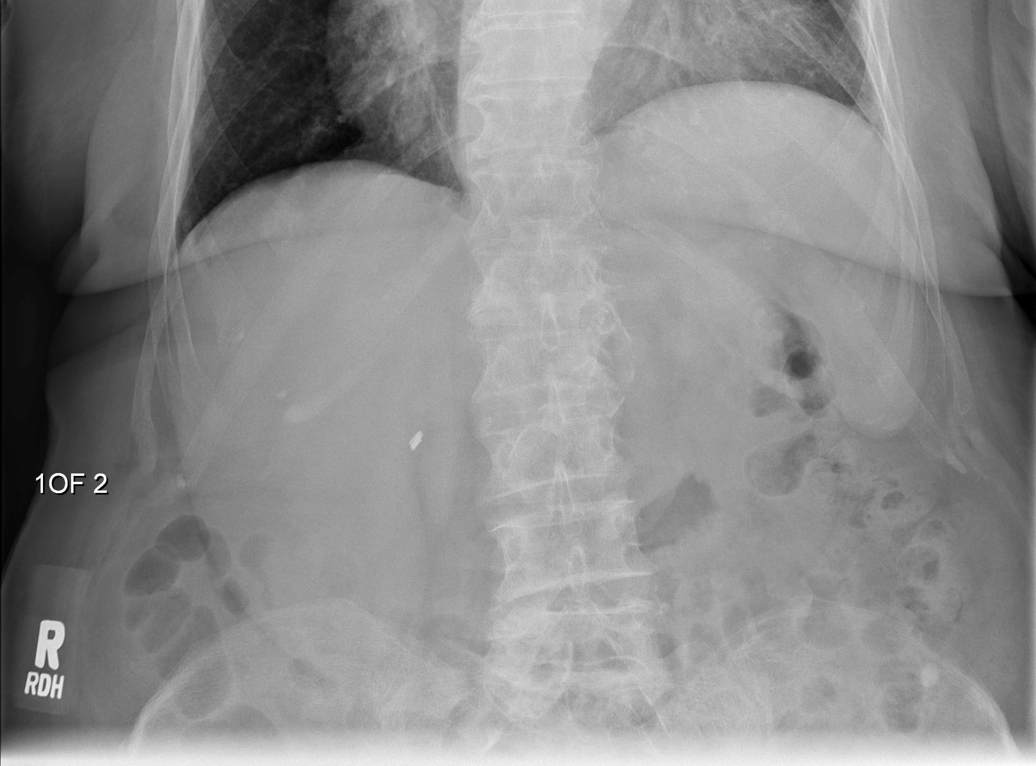
[im 5/5]
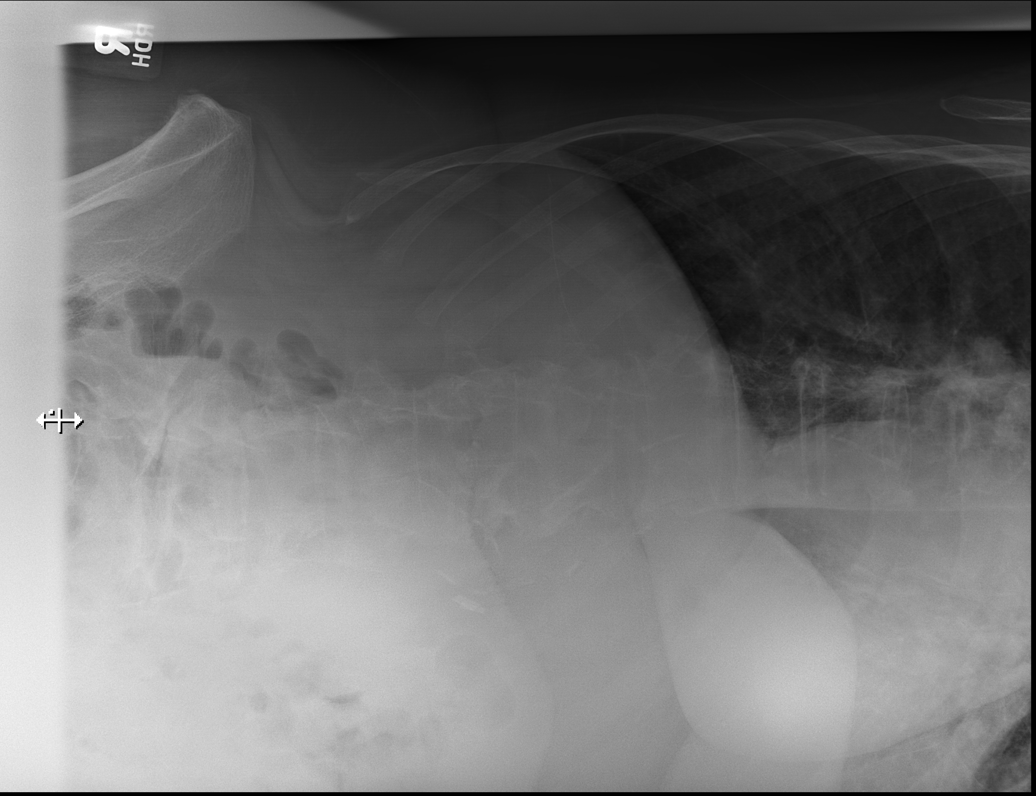

[5 of 5 positions shown; findings below may reference images not displayed]

PROCEDURE:     DXR - DXR ABDOMEN 3-WAY (INCL PA CXR)  - June 13, 2011  [DATE]

RESULT:     The lungs are well-expanded. The cardiac silhouette is top
normal in size. The central pulmonary vascularity is mildly prominent. I see
no interstitial edema or pneumonia. There is no pleural effusion.

Within the abdomen there are are numerous loops of normal calibered
partially gas-filled small and large bowel. I do not see evidence of
obstruction or perforation. There is some gas in the region of the proximal
rectum. There is a coarse calcification to the right of midline in the true
bony pelvis compatible with a uterine fibroid.

There are is calcification in the wall of the abdominal aorta with evidence
of tortuosity. There are surgical clips in the right upper quadrant
consistent with previous cholecystectomy.
IMPRESSION: 1. The bowel gas pattern is nonspecific and may reflect constipation. There
is no evidence of ileus nor obstruction or perforation.
2. I do not see acute abnormality elsewhere within the abdomen or pelvis.
3. I do not see acute cardiopulmonary abnormality.

## 2014-07-12 NOTE — H&P (Signed)
PATIENT NAME:  Grace Bennett, MURTAGH MR#:  [id number] DATE OF BIRTH:  17-Jul-1918  DATE OF ADMISSION:  01/07/2013  PRIMARY CARE PHYSICIAN:  Duncan Dull, MD  CHIEF COMPLAINT: Three episodes of bleeding from the rectum.   HISTORY OF PRESENT ILLNESS: This is a 81 year old female who is followed by hospice for CHF and atrial fibrillation. She presents this morning with 3 large bloody bowel movements starting at 6:00 a.m., noted as a large amount of blood, dark red with thick clots in the toilet. She has had some nausea and some left lower quadrant abdominal pain, not that severe. She does have a history of constipation and took some sorbitol yesterday. Last night, she did notice a speck of blood when she wiped. Pain not that severe, nothing made it better or worse. The patient does take an aspirin for blood thinner. Hospitalist services were contacted for further evaluation. The patient's blood pressure is on the lower side. Hemoglobin in the ER was 10.3.   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   PAST SURGICAL HISTORY: Cholecystectomy, left renal fixation surgery, thyroidectomy.   ALLERGIES: No known drug allergies.   MEDICATIONS: As per Prescription Writer include Ambien 5 mg at bedtime, aspirin 81 mg daily, nose spray Astelin/Flonase combination twice a day, Colace 1 tablet twice a day, Lexapro 10 mg daily, Lasix 40 mg daily, levothyroxine 137 mcg daily, milk of magnesia 30 mL daily as needed, MiraLax 17 grams daily as needed for constipation, nitroglycerin 0.4 mg sublingually every 5 minutes, Protonix 40 mg daily, potassium chloride 20 mEq daily, Roxanol 20 mL as needed for pain  or shortness of breath, tramadol 50 mg twice a day, Tylenol 650 mg every 4 hours as needed.   SOCIAL HISTORY: The patient lives alone, supposed to move to assisted living on Wednesday. No smoking, alcohol or drug use.   FAMILY HISTORY: Mother died of heart failure and kidney failure at age 57. Father died at 73 of a myocardial infarction. Her brother died at 31 of an MI.    REVIEW OF SYSTEMS: CONSTITUTIONAL: Positive for fatigue and dry mouth. No fever, chills or sweats.  EYES: She does wear glasses and has macular degeneration.  EARS, NOSE, MOUTH AND THROAT: Decreased hearing. Positive for postnasal drip. Positive for dysphagia, eats a soft diet.  CARDIOVASCULAR: Occasional chest pain, did have some this week.  RESPIRATORY: Positive for shortness of breath, worse recently. Positive cough, whitish phlegm.  GASTROINTESTINAL: Some nausea, left lower quadrant pain, dark red blood per rectum this morning.  GENITOURINARY: Positive for burning on urination. No hematuria.  MUSCULOSKELETAL: Positive for balance issues.  INTEGUMENT: No rashes or eruptions.  NEUROLOGIC: No fainting or blackouts.  PSYCHIATRIC: On medication for depression.  ENDOCRINE: Positive for hypothyroidism.  HEMATOLOGIC AND LYMPHATIC: History of anemia.   PHYSICAL EXAMINATION: VITAL SIGNS: On presentation included a temperature of 97.8, pulse 98, respirations 18, blood pressure 124/74, pulse ox 98% on 2 liters.  GENERAL: No respiratory distress, lying flat in bed.  EYES: Conjunctivae and lids normal. Pupils equal, round and reactive to light. Extraocular muscles intact. No nystagmus.  EARS, NOSE, MOUTH AND THROAT: Tympanic membranes no erythema. Nasal mucosa no erythema. Throat no erythema, no exudate seen. Lips and gums no lesions.  NECK: No JVD. No bruits. No lymphadenopathy. No thyromegaly. No thyroid nodules palpated.  RESPIRATORY: Lungs decreased breath sounds bilaterally. Positive rales bilateral bases.  CARDIOVASCULAR:  S1, S2, irregularly irregular. No gallops, rubs or murmurs heard. Carotid upstroke 2+ bilaterally. No bruits. Dorsalis pedis pulses 2+ bilaterally, 2+ edema, bilateral  lower extremity.  ABDOMEN: Soft. Positive tenderness in the left lower quadrant. No organomegaly/splenomegaly. Normoactive bowel sounds. No masses felt.  LYMPHATIC: No lymph nodes in the neck.  MUSCULOSKELETAL: Edema 2+. No clubbing. No cyanosis.  SKIN: No ulcers or lesions seen.  NEUROLOGIC: Cranial nerves II through XII grossly intact. Deep tendon reflexes half plus bilateral lower extremities.  PSYCHIATRIC: The patient is alert, oriented to person, place and time.   LABORATORY AND RADIOLOGICAL DATA:  Urinalysis trace leukocyte esterase. White blood cell count 6.6, H and H 10.3 and 31.2, platelet count 173. Glucose 94, BUN 22, creatinine 1.22, sodium 143, potassium 3.6, chloride 107, CO2 31, calcium 8.0. Liver function tests normal range. Albumin low at 3.0. INR 1.2, PT 15.5, PTT 35.4. Troponin negative. EKG shows atrial fibrillation, left axis deviation, left bundle branch block.   ASSESSMENT AND PLAN: 1.  Lower gastrointestinal bleed, likely diverticular in nature. I will get serial hemoglobin and hematocrit and transfused if needed. Currently no need for transfusion since the patient has not bled since being in the ER. Will continue to monitor. If further bleeding, can do a bleeding scan. Will hold aspirin at this time and put on a liquid diet. We will try to hold off on IV fluids with the history of congestive heart failure.  2.  History of congestive heart failure. Looking back at last echocardiogram, ejection fraction is 20% to 25%, which makes this systolic in nature. Currently no signs of heart failure at this point. I am hesitant with IV fluids since the patient does have a history of heart failure. We will continue to monitor clinically. Continue the patient's Lasix but at a lower dose. Patient is not on any other cardiac  medications.  3.  Atrial fibrillation. Now that we are stopping the aspirin, the patient is at a higher risk of stroke. The patient is rate controlled at this point.  4.  History of hypertension. Blood pressure on the lower side, will continue to monitor.  5.  Hypothyroidism. Continue levothyroxine at this time.  6.  Chronic kidney disease, stable. Will watch at this point.  7.  History of depression, on Lexapro. Will continue.  8.  The patient is followed by hospice, is a DO NOT RESUSCITATE, would likely be conservative with management. Will continue to monitor closely.  TIME SPENT ON ADMISSION: 55 minutes.      ____________________________ Herschell Dimesichard J. Renae GlossWieting, MD rjw:cs D: 01/07/2013 13:39:38 ET T: 01/07/2013 14:43:11 ET JOB#: [id number]  cc: Herschell Dimesichard J. Renae GlossWieting, MD, <Dictator> Duncan Dulleresa Tullo, MD Salley ScarletICHARD J Kareen Hitsman MD ELECTRONICALLY SIGNED 01/08/2013 10:19

## 2014-07-12 NOTE — Consult Note (Signed)
PATIENT NAME:  Grace Bennett, Grace Bennett MR#:  [id number] DATE OF BIRTH:  [birth year]  DATE OF CONSULTATION:  01/08/2013  REFERRING PHYSICIAN:  Herschell Dimesichard J. Renae GlossWieting, MD CONSULTING PHYSICIAN:  Joselyn ArrowKandice Bennett. Shamell Suarez, NP PRIMARY CARE PHYSICIAN: Duncan Dulleresa Tullo, MD  REASON FOR CONSULTATION: Rectal bleeding,   HISTORY OF PRESENT ILLNESS: Ms. Grace Bennett is a pleasant 81 year old female with history of congestive heart failure and atrial fibrillation with an ejection fraction of 20% to 25%. Yesterday morning around 6:00 a.m., she had 3 burgundy bowel movements with clots. She did have some transient left lower quadrant pain. She tells me she has had at least 6 episodes of rectal bleeding. At this time, she has some pain all over her abdomen that she rates at 6 out of 10 on pain scale and it is constant. Nothing seems to make the pain better or worse. She has had some nausea without vomiting. She has had some shortness of breath and dizziness while lying supine. Her hemoglobin was 10.3 on admission and is down to 8.8 today. She has an INR of 1.2. She did have a previous history of lower GI bleeding in 2011 and was seen by Dr. Niel HummerIftikhar. It was felt that she had likely diverticular bleeding. She was on Coumadin at that time. Dr. Mechele CollinElliott did last colonoscopy in 2005 and she was found to have internal hemorrhoids and diverticulosis. She had a nuclear medicine GI bleeding scan last night, which was normal. She denies any NSAIDs. She does take an occasional Tylenol as needed for pain.   PAST MEDICAL AND SURGICAL HISTORY: Diverticulosis (last colonoscopy 2005, internal hemorrhoids by Dr. Mechele CollinElliott and diverticulosis), thyroidectomy for left vocal cord paralysis, coronary artery disease, congestive heart failure, atrial fibrillation, hypertension, appendectomy, cholecystectomy and left breast surgery for Paget's disease.   MEDICATIONS PRIOR TO ADMISSION: Ambien 5 mg at bedtime, aspirin 81 mg daily, azelastine/fluticasone 137/50 mcg 2 times daily  p.r.n., DOK Plus 1 orally b.i.d., escitalopram 10 mg daily, Lasix 40 mg daily, levothyroxine 137 mcg daily, milk of magnesia 30 mL daily, MiraLax p.r.n., nitroglycerin 0.4 mg every 5 minutes p.r.n. pantoprazole 40 mg at bedtime, potassium chloride extended-release 20 mEq daily, Roxanol 20mg  daily, tramadol 50 mg b.i.d. p.r.n., Tylenol 650 mg tablets extended-release 2 tablets every 4 hours as needed.   ALLERGIES: No known drug allergies.   FAMILY HISTORY: Mother deceased with heart failure and renal failure. Father deceased of coronary artery disease and MI. Brother deceased of MI. There is no known family history of carcinoma of the liver or chronic GI problems.   SOCIAL HISTORY: She lives alone. She is a widow. She denies any tobacco, alcohol or illicit drug use. She does not have any children. She is retired from [employer].   REVIEW OF SYSTEMS: See HPI, otherwise negative 10-point review of systems.   PHYSICAL EXAMINATION: VITAL SIGNS: Temperature 98, pulse 89, respirations 20, blood pressure 114/86, O2 sat 94% on 2 liters through nasal cannula.  GENERAL: She is a well-developed, well-nourished, alert, oriented, pleasant, cooperative Caucasian female in no acute distress.  HEENT: Sclerae clear, anicteric. Conjunctivae pale. Oropharynx pink and moist without lesions.  NECK: Supple without mass or thyromegaly.  CHEST: Heart rate is irregularly irregular.  LUNGS: Clear to auscultation bilaterally.  ABDOMEN: Positive bowel sounds x 4. She does have AVMs and skin tags to her abdomen.  EXTREMITIES: She has 1+ pedal edema bilaterally. No clubbing.  SKIN: Pale, warm and dry without any rash or jaundice.  PSYCHIATRIC: Alert, cooperative. Normal mood and affect.  NEUROLOGIC: Grossly intact.  MUSCULOSKELETAL: Good equal movement and strength bilaterally.   LABORATORY STUDIES: Potassium 3.4, chloride 109, calcium 8, otherwise normal basic metabolic panel. Lipase 171. CBC normal except hemoglobin as above.  Albumin 3, otherwise normal LFTs.   IMPRESSION: Ms. Cerveny is a very pleasant 81 year old female with atrial fibrillation, on aspirin, with recurrent gastrointestinal bleeding. History of presumed diverticular bleeding in 2011 when she was on Coumadin, which has since been discontinued. Her last colonoscopy was in 2005 by Dr. Mechele Collin. She had a bleeding scan last night that was normal. Her hemoglobin has dropped from 10.3 to 8.8 this admission. She really prefers not to have aggressive measures if possible, including colonoscopy and EGD. She may be bleeding from intermittent diverticula, therefore not picked up on bleeding scan, or small bowel source such as arteriovenous malformations, less likely rapid transit upper gastrointestinal source.   PLAN: 1.  Monitor hemoglobin and hematocrit.  2.  Agree with Protonix b.i.d.  3.  Dr. Servando Snare and I will follow with you.   Thank you for allowing [place] to participate in the care of Ms. Sprankle.   ____________________________ Joselyn Arrow, NP klj:jm D: 01/08/2013 21:16:19 ET T: 01/08/2013 21:55:54 ET JOB#: [id number]  cc: Joselyn Arrow, NP, <Dictator> Duncan Dull, MD Joselyn Arrow FNP ELECTRONICALLY SIGNED 01/10/2013 10:35

## 2014-07-12 NOTE — H&P (Signed)
PATIENT NAME:  Grace Bennett, Grace Bennett MR#:  [id number] DATE OF BIRTH:  1918-09-30  PRIMARY CARE PHYSICIAN: Duncan Dulleresa Tullo, MD   PRIMARY CARDIOLOGIST: Antonieta Ibaimothy J. Gollan, MD   CHIEF COMPLAINT: Cough, shortness of breath, weakness, not getting better.   HISTORY OF PRESENT ILLNESS: This is a very pleasant 81 year old female with a history of diastolic heart failure, atrial fibrillation, coronary artery disease, hypertension, who presents today with the above complaints. Over the past week, the patient has  been treated as an outpatient for bronchitis. She was given amoxicillin 500 t.i.d. She saw Dr. Darrick Huntsmanullo, her primary care physician, on Tuesday. At that time, she was given nebulizers and also IV Solu-Medrol. She actually felt better with the Solu-Medrol, better on Wednesday and Thursday. However, over Friday and this morning, she just felt worse with wheezing, cough and just feeling weak. She says that she is having white phlegm. She feels feverish and sweaty with chills. She also acknowledges dyspnea on exertion and she has been sleeping on a recliner for the past few days. She denies any lower extremity edema. She had one episode of chest pain three weeks ago when she took nitroglycerin which relieved the chest pain, but none since then. She has had some epigastric pain, which is worse with coughing and better when she does not cough. She also says that she has had decreased urine output over the past day.   REVIEW OF SYSTEMS: CONSTITUTIONAL: Positive probable fever. Positive fatigue and weakness. Unknown weight loss, weight gain.  EYES: She has macular degeneration and cannot see well. ENT: No tinnitus, ear pain. She does have some hearing loss. No seasonal allergies, postnasal drip or snoring.  RESPIRATORY: Positive cough. Positive wheezing. Positive dyspnea.  CARDIOVASCULAR: Positive chest pain as mentioned above. No palpitations or syncope. No edema. She does have atrial fibrillation. Positive dyspnea on  exertion.   GASTROINTESTINAL: No nausea, vomiting, diarrhea. She has epigastric abdominal pain, worse with coughing. No melena or ulcers.  GENITOURINARY:  Positive decreased urine output. No dysuria or hematuria.  ENDOCRINE: No polyuria, polydypsia, or thyroid problems, or heat or cold intolerance. HEMATOLOGY AND LYMPHATIC: No anemia. She does bruise easily. No bleeding.  SKIN:  No rash or lesions.  MUSCULOSKELETAL: No pain in the shoulders or knees or hips. She has some limited activity. She walks with a walker NEUROLOGIC:  No history of CVA. Positive TIA. No dementia or dysarthria.  PSYCHIATRIC:  No history of anxiety.   PAST MEDICAL HISTORY:  1.  Atrial fibrillation.  2.  Congestive heart failure diastolic in nature. She had an echocardiogram in 2012 which showed an EF of 50% to 55%, but she does have diastolic dysfunction.  3.  History of CAD.  4.  Hypertension.  5.  She has had a history of a TIA in the past. 6.  History of Paget's disease of the left breast. 7.  History of GI bleed while on Coumadin.  8.  Thyroid disease.  9.  Lower GI bleed while on Coumadin.  10.  Anemia.   PAST SURGICAL HISTORY:  1.  Cholecystectomy.  2.  Left renal fixation surgery.  3.  Thyroidectomy.  4.  Fetal blood transfusion for diverticulitis and GI bleed.   SOCIAL HISTORY: The patient lives alone. No tobacco, alcohol or drug use.   FAMILY HISTORY: Unknown.   MEDICATIONS: The patient currently is taking:  1.  Xanax 0.25 mg at bedtime p.r.n.  2.  Amiodarone 200 mg daily.  3.  Vitamin B12 once a  month. 4.   Lexapro 10 mg daily.  5.  Lasix 20 mg daily.  6.  Synthroid 137 mcg daily.  7.  Nitroglycerin sublingual p.r.n.  8.  Protonix 40 mg daily.  9.  K-Dur 10 mg daily.  10.  Temazepam 15 mg at bedtime p.r.n.   PHYSICAL EXAMINATION:  VITAL SIGNS: Temperature is 98.4, pulse 70, respirations 20, blood pressure 139/84, 97% on 2 liters. Apparently she was in the low 90s, about 89% to 90% on room  air.  GENERAL: The patient is alert and oriented. Not in acute distress. HEENT: Head is atraumatic. Pupils are round and reactive. Sclerae anicteric. Mucous membranes are moist.  OROPHARYNX: Clear.  NECK: Supple without JVD, carotid bruit or enlarged thyroid.  CARDIOVASCULAR: She has regular rate and rhythm. No murmurs, gallops, or rubs. PMI is not displaced.  ABDOMEN: Bowel sounds positive. Nontender, nondistended. No hepatosplenomegaly. No rebound or guarding.  GENITOURINARY: Deferred.  MUSCULOSKELETAL: No pathology to digits or nails.  Extremities move x4. No tenderness or effusion. Range of motion is adequate. Strength and tone equal bilaterally, stable.  SKIN: Within normal limits. Well-hydrated, no diaphoresis. No obvious wounds or rashes.  NEUROLOGIC: Cranial nerves II through XII are intact. There are no focal deficits. Motor strength is four out of four bilateral upper and lower extremities.  LYMPH NODES: No lymphadenopathy.   Laboratory, diagnostic and radiological data: Sodium 144, potassium 4.4, chloride 110, bicarbonate 28, BUN 18, creatinine 1.07, glucose 118, BNP 8064, total protein 7.1, albumin 2.9, bilirubin 0.4, alkaline phosphatase 106, AST 116, ALT 98. Troponin less than 0.02. White blood cells 4.1, hemoglobin 12.9, hematocrit 39.4, platelets 202. EKG shows atrial fibrillation initially, heart rate was 107. It is now down in the 80s. She has an old left bundle branch block.   ASSESSMENT AND PLAN: This is an 81 year old female with a history of diastolic congestive heart failure, atrial fibrillation who presents with acute diastolic heart failure on chronic heart failure.  1.  Acute on chronic diastolic heart failure. The patient will be admitted to telemetry. We will use Lasix 40 IV q.12h., monitoring In and Out and daily weights. Hold her oral Lasix. At this time I do not feel like we need a cardiology consult. However if needed in the a.m., the MD may order this.  She had an  echocardiogram in 2012 which showed an ejection fraction of 50% to 55%. We will not repeat this echocardiogram.  2.  Atrial fibrillation. Initially, the patient was in rapid ventricular response. Her heart rates actually improved. We will continue to monitor on telemetry. Continue amiodarone.  3.  Depression. We will continue Lexapro.  4.  Hypertension. It does not appear that the patient is on any blood pressure medications. She was on hydrochlorothiazide, but it sounds like this has been discontinued.  5.  Chest pain. She had one episode of chest pain recently. She did take nitroglycerin. This was a couple of weeks ago. We will continue to monitor her on telemetry and follow her cardiac enzymes.  6.  CODE STATUS: The patient is a DO NOT RESUSCITATE status.   TIME SPENT: Approximately 50 minutes.     ____________________________ Janyth Contes. Juliene Pina, MD spm:de D: 08/19/2012 15:16:47 ET T: 08/19/2012 16:27:16 ET JOB#: [id number]  cc: Vikash Nest P. Juliene Pina, MD, <Dictator> Duncan Dull, MD Antonieta Iba, MD Janyth Contes Kimori Tartaglia MD ELECTRONICALLY SIGNED 08/19/2012 18:49

## 2014-07-12 NOTE — Discharge Summary (Signed)
PATIENT NAME:  Grace Bennett, Grace Bennett MR#:  [id number] DATE OF BIRTH:  14-Oct-1918  DATE OF ADMISSION:  01/07/2013 DATE OF DISCHARGE:  01/10/2013  PRESENTING COMPLAINT: Rectal bleed.   DISCHARGE DIAGNOSES: 1.  Acute diverticular bleed, recurrent.  2.  Chronic constipation.  3.  Chronic congestive heart failure, on home oxygen, 2 liters nasal cannula.   CONDITION ON DISCHARGE: Fair.   CODE STATUS: NO CODE, DNR.   DISCHARGE MEDICATIONS: 1.  Protonix 40 mg daily.  2.  Levothyroxine 0.137 mg p.o. daily.  3.  Escitalopram 10 mg daily.  4.  Ambien 5 mg at bedtime as needed.  5.  Tylenol 650 mg extended release 2 tablets every 4 hours as needed.  6.  Azelastine/fluticasone nasal spray 2 times a day as needed.  7.  Lasix 40 mg daily.  8.  Milk of Magnesia 30 mL daily as needed.  9.  Nitroglycerin 0.4 mg sublingual as needed.  10.  K-Dur 20 mEq p.o. daily.  11.  Docusate Plus 1 tablet daily b.i.d.  12.  MiraLax p.o. daily.  13.  Roxanol 0.25 mL every 3 hours as needed.  14.  Tramadol 50 mg b.i.d. as needed.   DISCHARGE INSTRUCTIONS: 1.  The patient is advised to start taking aspirin after 1 week.  2.  Home hospice services to be resumed as outpatient.   DISCHARGE DIET: Low sodium, mechanical soft.  DISCHARGE FOLLOWUP: With Dr. Darrick Huntsmanullo as outpatient in 1 to 2 weeks.   LABORATORY AND DIAGNOSTICS: Hemoglobin at discharge was 8.5. White count was 6.9. GI blood loss scan was negative. Hemoglobin on admission was 10.1.   BRIEF SUMMARY OF HOSPITAL COURSE: Grace Bennett is a pleasant, 81 year old, Caucasian female with history of congestive heart failure, chronic, on chronic home oxygen, history of hypothyroidism and chronic constipation who comes to the Emergency Room with:  1.  Rectal bleed, likely diverticular in nature. The patient has had symptoms of constipation on and off for a long time. She came in with hemoglobin of 10.3, went down to 7.9 and prior to discharge was 8.5. The patient did not have  further bloody stools other than some dark stool which appeared to be old blood. She has had diverticular bleed in the past. The patient and family requested no aggressive workup. She was observed. Did not require any blood transfusion. Stool softener and MiraLax were added to her medication regimen. 2.  History of congestive heart failure, systolic, ejection fraction of 20% to 25%. No signs of heart failure. Lasixwas continued along with coreg 3.  Chronic atrial fibrillation. We held aspirin because of gastrointestinal bleed. She will resume after 1 week. Heart rate is controlled at this point. she understands the risk of cva being off asa 4.  History of hypertension. 5.  Hypothyroidism.  Her levothyroxine was continued. 6.  History of depression. On Lexapro. 7.  Chronic kidney disease. creat stable The patient is a NO CODE, DNR.   DISPOSITION: She will be [facility] to [facility] with hospice services resumed at discharge.  TIME SPENT:  40 minutes. ____________________________ Wylie HailSona A. Allena KatzPatel, MD sap:sb D: 01/11/2013 07:38:32 ET T: 01/11/2013 08:04:02 ET JOB#: [id number]  cc: Saniya Tranchina A. Allena KatzPatel, MD, <Dictator> Duncan Dulleresa Tullo, MD  Willow OraSONA A Wandy Bossler MD ELECTRONICALLY SIGNED 01/14/2013 10:56

## 2014-07-12 NOTE — Consult Note (Signed)
Chief Complaint:  Subjective/Chief Complaint Pt reports palpitations, weakness & fatigue last night.  No further episodes of hematochezia.  SOBOE.  Denies abdominal pain, nausea or vomiting.   VITAL SIGNS/ANCILLARY NOTES: **Vital Signs.:   21-Oct-14 05:51  Temperature Temperature (F) 97  Celsius 36.1  Temperature Source axillary  Pulse Pulse 82  Respirations Respirations 18  Systolic BP Systolic BP 114  Diastolic BP (mmHg) Diastolic BP (mmHg) 72  Mean BP 86  Pulse Ox % Pulse Ox % 97  Pulse Ox Activity Level  At rest  Oxygen Delivery 2L   Brief Assessment:  GEN Pale, A/Ox3   Cardiac Irregular   Respiratory normal resp effort   Gastrointestinal details normal Soft  Nontender  Nondistended  Bowel sounds normal  No rebound tenderness  No gaurding   EXTR negative cyanosis/clubbing, negative edema   Additional Physical Exam Skin: pale, warm, dry NW:GNFAOP:pink, moist   Lab Results: Routine Hem:  21-Oct-14 06:12   Hemoglobin (CBC)  7.9 (Result(s) reported on 09 Jan 2013 at 07:07AM.)   Assessment/Plan:  Assessment/Plan:  Assessment Lower GI Bleed:  Suspected diverticular, however pt declines endoscopic eval/intervention at this time & cannot r/o malignancy or AVMs & NM bleeding scan was normal.  She understands risks of continued bleeding, however she tells me she is "at peace" with her decision & realizes associated risks. Anemia: secondary to GI blood loss.  Hgb dropped to 7.9.   Plan 1)  Continue Protonix b.i.d.  2) Continue supportive measures 3) Consider transfusion PRBCs if hgb continues to decline We will sign off on patient.  Please call if there are any questions or concerns or if pt decides she wouldlike endoscopic evaluation.  Thank you for allowing us to participate in the care of this patient.   Electronic Signatures: Joselyn ArrowJones, Cecila Satcher L (NP)  (Signed 21-Oct-14 09:16)  Authored: Chief Complaint, VITAL SIGNS/ANCILLARY NOTES, Brief Assessment, Lab Results,  Assessment/Plan   Last Updated: 21-Oct-14 09:16 by Joselyn ArrowJones, Talea Manges L (NP)

## 2014-07-12 NOTE — Consult Note (Signed)
Brief Consult Note: Diagnosis: GI bleed.   Consult note dictated.   Comments: Ms. Grace Bennett is a very pleasant 81 y/o female with atrial fibrillation on aspirin with recurrent GI bleeding. Hx presumed diverticular bleeding in 2011.  Last colonoscopy 2005 by Dr Mechele CollinElliott.  She had a bleeding scan last night that was normal.  Hgb has dropped from 10.3 to 8.8.  Pt really prefers not to have aggressive measures if possible including colonoscopy/EGD.  She may be bleeding from intermittent diverticulae therefore not picked up on bleeding scan or small bowel source such as AVMs, less likely rapid transit upper GI source.   Plan: 1) Monitor H/H 2) Agree with protonix BID 3) Dr Servando SnareWohl & I will follow with you  Please see full dictated note.  Thanks for consult.  #[id number].  Electronic Signatures: Joselyn ArrowJones, Tigran Haynie L (NP)  (Signed 20-Oct-14 21:16)  Authored: Brief Consult Note   Last Updated: 20-Oct-14 21:16 by Joselyn ArrowJones, Ka Bench L (NP)

## 2014-07-12 NOTE — Discharge Summary (Signed)
PATIENT NAME:  Grace Bennett, Grace Bennett MR#:  [id number] DATE OF BIRTH:  26-Jul-1918  DATE OF ADMISSION:  08/19/2012 DATE OF DISCHARGE:  08/21/2012  PRIMARY CARE PHYSICIAN:  Duncan Dulleresa Tullo, MD  PRESENTING COMPLAINT: Shortness of breath, cough and weakness.   DISCHARGE DIAGNOSES: 1.  Acute on chronic diastolic congestive heart failure, improved.  2.  Chronic atrial fibrillation, on amiodarone. 3.  Cough, congestion and upper respiratory infection with acute mild bronchitis, currently on antibiotics.  4.  Depression.  5.  Hypertension.  6.  Generalized weakness.   CODE STATUS: NO CODE, DO NOT RESUSCITATE.  LABORATORY AND DIAGNOSTICS: Lung VQ scan:  Low probability for PE. Appearance suggests (Dictation Anomaly) perfusion.   Creatinine is 1.10, sodium is 145 and potassium is 3.7. Cardiac enzymes x 3 negative. Blood cultures negative in 48 hours.   Chest x-ray consistent with mild cardiomegaly and atelectasis bilaterally, minimally.   White count 12.1 on admission. B-type natriuretic peptide was 8064. Albumin is 2.9.   CONSULTANTS:  None.  DISCHARGE MEDICATIONS: 1.  Tylenol 650 mg q. 4 p.r.n.  2.  Amiodarone 200 mg daily.  3.  Ambien 5 mg at bedtime p.r.n.  4.  Lexapro 10 mg daily.  5.  Restoril 15 mg at bedtime p.r.n.  6.  Protonix 40 mg q. a.m.  7.  Synthroid 0.137 p.o. daily.  8.  Amoxicillin 500 mg q. 8 hourly for 5 more days.  9.  Lasix 20 mg every other day.  10.  KCl 10 mEq daily.  11.  Combivent oral inhaler 2 puffs q.i.d. p.r.n.   DISCHARGE DIET:  2 gram sodium.  DISCHARGE ACTIVITY:  Physical therapy.   DISCHARGE INSTRUCTIONS:  Follow up with Dr. Darrick Huntsmanullo in a week to 10 days.   BRIEF SUMMARY OF HOSPITAL COURSE: Grace Bennett is a pleasant 81 year old Caucasian female with past medical history of diastolic congestive heart failure and chronic A-fib who comes in with: 1.  Acute on chronic diastolic congestive heart failure. The patient was placed on telemetry floor, received 2 grams  sodium diet and was placed on Lasix 40 mg b.i.d. She diuresed very well and her sats remained more than 92% on room air. Her echo was done in 2012, which showed EF of 50% to 55%.  We did not repeat another echo. The patient's cardiac enzymes x 3 were negative. 2.  Cough, congestion and URI with acute mild bronchitis. The patient was started on p.o. amoxicillin as outpatient. We continued the antibiotics in house. Will finish with the course for a total 10 days. No indication was noted for steroids. Combivent inhaler p.r.n. was given for wheezing.  3.  Chronic A-fib.  Initially was in rapid A-fib in the setting of CHF. Once the patient's CHF resolved, her heart rate improved. She was continued on amiodarone. 4.  Depression.  Lexapro was continued.  5.  History of hypertension. The patient does not appear to be on any blood pressure meds. Her blood pressure remained stable.  6.  Generalized weakness. Physical therapy saw the patient and recommended rehab. The patient is agreeable to it. [facility] Discharge plan was discussed with family members. The patient remained a NO CODE, DO NOT RESUSCITATE.   TIME SPENT: 40 minutes.  ____________________________ Wylie HailSona A. Allena KatzPatel, MD sap:sb D: 08/21/2012 13:12:27 ET T: 08/21/2012 13:56:56 ET JOB#: [id number]  cc: Mamie Hundertmark A. Allena KatzPatel, MD, <Dictator> Duncan Dulleresa Tullo, MD Willow OraSONA A Vaneta Hammontree MD ELECTRONICALLY SIGNED 08/24/2012 19:38
